# Patient Record
Sex: Female | Born: 1935 | Race: White | Hispanic: No | Marital: Single | Smoking: Never smoker
Health system: Southern US, Community
[De-identification: ages and names within clinical notes are randomized; demographics above are authoritative.]

## PROBLEM LIST (undated history)

## (undated) DIAGNOSIS — H269 Unspecified cataract: Secondary | ICD-10-CM

## (undated) DIAGNOSIS — K219 Gastro-esophageal reflux disease without esophagitis: Secondary | ICD-10-CM

---

## 2018-04-14 ENCOUNTER — Inpatient Hospital Stay (HOSPITAL_COMMUNITY)
Admission: EM | Admit: 2018-04-14 | Discharge: 2018-04-20 | DRG: 871 | Disposition: A | Discharge status: Hospice | Payer: Medicare Other | Attending: Internal Medicine | Admitting: Internal Medicine

## 2018-04-14 ENCOUNTER — Emergency Department (HOSPITAL_COMMUNITY): Payer: Medicare Other

## 2018-04-14 ENCOUNTER — Other Ambulatory Visit: Payer: Self-pay

## 2018-04-14 ENCOUNTER — Observation Stay (HOSPITAL_COMMUNITY): Payer: Medicare Other

## 2018-04-14 ENCOUNTER — Encounter (HOSPITAL_COMMUNITY): Payer: Self-pay | Admitting: *Deleted

## 2018-04-14 DIAGNOSIS — J9601 Acute respiratory failure with hypoxia: Secondary | ICD-10-CM | POA: Diagnosis present

## 2018-04-14 DIAGNOSIS — E440 Moderate protein-calorie malnutrition: Secondary | ICD-10-CM | POA: Diagnosis present

## 2018-04-14 DIAGNOSIS — D649 Anemia, unspecified: Secondary | ICD-10-CM | POA: Diagnosis not present

## 2018-04-14 DIAGNOSIS — F0390 Unspecified dementia without behavioral disturbance: Secondary | ICD-10-CM | POA: Diagnosis present

## 2018-04-14 DIAGNOSIS — E538 Deficiency of other specified B group vitamins: Secondary | ICD-10-CM | POA: Diagnosis present

## 2018-04-14 DIAGNOSIS — J189 Pneumonia, unspecified organism: Secondary | ICD-10-CM | POA: Diagnosis present

## 2018-04-14 DIAGNOSIS — J90 Pleural effusion, not elsewhere classified: Secondary | ICD-10-CM | POA: Diagnosis not present

## 2018-04-14 DIAGNOSIS — N183 Chronic kidney disease, stage 3 (moderate): Secondary | ICD-10-CM | POA: Diagnosis present

## 2018-04-14 DIAGNOSIS — R601 Generalized edema: Secondary | ICD-10-CM

## 2018-04-14 DIAGNOSIS — C787 Secondary malignant neoplasm of liver and intrahepatic bile duct: Secondary | ICD-10-CM | POA: Diagnosis present

## 2018-04-14 DIAGNOSIS — Z7982 Long term (current) use of aspirin: Secondary | ICD-10-CM

## 2018-04-14 DIAGNOSIS — E8779 Other fluid overload: Secondary | ICD-10-CM | POA: Diagnosis not present

## 2018-04-14 DIAGNOSIS — A409 Streptococcal sepsis, unspecified: Secondary | ICD-10-CM | POA: Diagnosis not present

## 2018-04-14 DIAGNOSIS — I5033 Acute on chronic diastolic (congestive) heart failure: Secondary | ICD-10-CM | POA: Diagnosis present

## 2018-04-14 DIAGNOSIS — Z79899 Other long term (current) drug therapy: Secondary | ICD-10-CM

## 2018-04-14 DIAGNOSIS — Z515 Encounter for palliative care: Secondary | ICD-10-CM | POA: Diagnosis not present

## 2018-04-14 DIAGNOSIS — N179 Acute kidney failure, unspecified: Secondary | ICD-10-CM | POA: Diagnosis present

## 2018-04-14 DIAGNOSIS — Z791 Long term (current) use of non-steroidal anti-inflammatories (NSAID): Secondary | ICD-10-CM

## 2018-04-14 DIAGNOSIS — I471 Supraventricular tachycardia: Secondary | ICD-10-CM | POA: Diagnosis present

## 2018-04-14 DIAGNOSIS — Z8249 Family history of ischemic heart disease and other diseases of the circulatory system: Secondary | ICD-10-CM

## 2018-04-14 DIAGNOSIS — L8990 Pressure ulcer of unspecified site, unspecified stage: Secondary | ICD-10-CM | POA: Clinically undetermined

## 2018-04-14 DIAGNOSIS — K219 Gastro-esophageal reflux disease without esophagitis: Secondary | ICD-10-CM | POA: Diagnosis present

## 2018-04-14 DIAGNOSIS — R6510 Systemic inflammatory response syndrome (SIRS) of non-infectious origin without acute organ dysfunction: Secondary | ICD-10-CM | POA: Diagnosis not present

## 2018-04-14 DIAGNOSIS — D631 Anemia in chronic kidney disease: Secondary | ICD-10-CM | POA: Diagnosis present

## 2018-04-14 DIAGNOSIS — Z66 Do not resuscitate: Secondary | ICD-10-CM | POA: Diagnosis not present

## 2018-04-14 DIAGNOSIS — N2889 Other specified disorders of kidney and ureter: Secondary | ICD-10-CM

## 2018-04-14 DIAGNOSIS — E46 Unspecified protein-calorie malnutrition: Secondary | ICD-10-CM | POA: Diagnosis present

## 2018-04-14 DIAGNOSIS — R0602 Shortness of breath: Secondary | ICD-10-CM

## 2018-04-14 DIAGNOSIS — E8770 Fluid overload, unspecified: Secondary | ICD-10-CM | POA: Diagnosis present

## 2018-04-14 DIAGNOSIS — I484 Atypical atrial flutter: Secondary | ICD-10-CM | POA: Diagnosis not present

## 2018-04-14 DIAGNOSIS — C649 Malignant neoplasm of unspecified kidney, except renal pelvis: Secondary | ICD-10-CM | POA: Diagnosis present

## 2018-04-14 DIAGNOSIS — J9811 Atelectasis: Secondary | ICD-10-CM | POA: Diagnosis present

## 2018-04-14 DIAGNOSIS — Z6821 Body mass index (BMI) 21.0-21.9, adult: Secondary | ICD-10-CM

## 2018-04-14 DIAGNOSIS — N289 Disorder of kidney and ureter, unspecified: Secondary | ICD-10-CM

## 2018-04-14 DIAGNOSIS — I130 Hypertensive heart and chronic kidney disease with heart failure and stage 1 through stage 4 chronic kidney disease, or unspecified chronic kidney disease: Secondary | ICD-10-CM | POA: Diagnosis present

## 2018-04-14 DIAGNOSIS — E871 Hypo-osmolality and hyponatremia: Secondary | ICD-10-CM | POA: Diagnosis not present

## 2018-04-14 DIAGNOSIS — I4891 Unspecified atrial fibrillation: Secondary | ICD-10-CM | POA: Diagnosis present

## 2018-04-14 DIAGNOSIS — C799 Secondary malignant neoplasm of unspecified site: Secondary | ICD-10-CM

## 2018-04-14 HISTORY — DX: Unspecified cataract: H26.9

## 2018-04-14 HISTORY — DX: Gastro-esophageal reflux disease without esophagitis: K21.9

## 2018-04-14 LAB — PROTIME-INR
INR: 1.2 (ref 0.8–1.2)
Prothrombin Time: 15.2 seconds (ref 11.4–15.2)

## 2018-04-14 LAB — CBC WITH DIFFERENTIAL/PLATELET
Abs Immature Granulocytes: 0 10*3/uL (ref 0.00–0.07)
Basophils Absolute: 0 10*3/uL (ref 0.0–0.1)
Basophils Relative: 0 %
Eosinophils Absolute: 0 10*3/uL (ref 0.0–0.5)
Eosinophils Relative: 0 %
HCT: 28.6 % — ABNORMAL LOW (ref 36.0–46.0)
HEMOGLOBIN: 8.8 g/dL — AB (ref 12.0–15.0)
Lymphocytes Relative: 1 %
Lymphs Abs: 0.2 10*3/uL — ABNORMAL LOW (ref 0.7–4.0)
MCH: 25.1 pg — ABNORMAL LOW (ref 26.0–34.0)
MCHC: 30.8 g/dL (ref 30.0–36.0)
MCV: 81.7 fL (ref 80.0–100.0)
Monocytes Absolute: 0.2 10*3/uL (ref 0.1–1.0)
Monocytes Relative: 1 %
Neutro Abs: 23.1 10*3/uL — ABNORMAL HIGH (ref 1.7–7.7)
Neutrophils Relative %: 98 %
Platelets: 430 10*3/uL — ABNORMAL HIGH (ref 150–400)
RBC: 3.5 MIL/uL — ABNORMAL LOW (ref 3.87–5.11)
RDW: 19.4 % — ABNORMAL HIGH (ref 11.5–15.5)
WBC: 23.6 10*3/uL — ABNORMAL HIGH (ref 4.0–10.5)
nRBC: 0 % (ref 0.0–0.2)
nRBC: 1 /100 WBC — ABNORMAL HIGH

## 2018-04-14 LAB — COMPREHENSIVE METABOLIC PANEL
ALT: 9 U/L (ref 0–44)
AST: 29 U/L (ref 15–41)
Albumin: 1.6 g/dL — ABNORMAL LOW (ref 3.5–5.0)
Alkaline Phosphatase: 76 U/L (ref 38–126)
Anion gap: 11 (ref 5–15)
BUN: 38 mg/dL — ABNORMAL HIGH (ref 8–23)
CHLORIDE: 107 mmol/L (ref 98–111)
CO2: 18 mmol/L — AB (ref 22–32)
Calcium: 9.3 mg/dL (ref 8.9–10.3)
Creatinine, Ser: 2 mg/dL — ABNORMAL HIGH (ref 0.44–1.00)
GFR calc Af Amer: 25 mL/min — ABNORMAL LOW (ref >60)
GFR calc non Af Amer: 22 mL/min — ABNORMAL LOW (ref >60)
Glucose, Bld: 89 mg/dL (ref 70–99)
Potassium: 5.1 mmol/L (ref 3.5–5.1)
Sodium: 136 mmol/L (ref 135–145)
Total Bilirubin: 0.4 mg/dL (ref 0.3–1.2)
Total Protein: 5.4 g/dL — ABNORMAL LOW (ref 6.5–8.1)

## 2018-04-14 LAB — URINALYSIS, ROUTINE W REFLEX MICROSCOPIC
Bilirubin Urine: NEGATIVE
Glucose, UA: NEGATIVE mg/dL
Ketones, ur: NEGATIVE mg/dL
Nitrite: NEGATIVE
Protein, ur: 100 mg/dL — AB
Specific Gravity, Urine: 1.024 (ref 1.005–1.030)
pH: 5 (ref 5.0–8.0)

## 2018-04-14 LAB — LACTIC ACID, PLASMA
Lactic Acid, Venous: 2.4 mmol/L (ref 0.5–1.9)
Lactic Acid, Venous: 2.3 mmol/L (ref 0.5–1.9)

## 2018-04-14 LAB — APTT: aPTT: 28 seconds (ref 24–36)

## 2018-04-14 LAB — I-STAT TROPONIN, ED: TROPONIN I, POC: 0.07 ng/mL (ref 0.00–0.08)

## 2018-04-14 LAB — PROCALCITONIN: Procalcitonin: 4.39 ng/mL

## 2018-04-14 LAB — BRAIN NATRIURETIC PEPTIDE: B Natriuretic Peptide: 652.9 pg/mL — ABNORMAL HIGH (ref 0.0–100.0)

## 2018-04-14 MED ORDER — VANCOMYCIN HCL IN DEXTROSE 1-5 GM/200ML-% IV SOLN: 1000.0000 mg | Freq: Once | INTRAVENOUS | Status: DC

## 2018-04-14 MED ORDER — ONDANSETRON HCL 4 MG/2ML IJ SOLN: 4.0000 mg | Freq: Four times a day (QID) | INTRAMUSCULAR | Status: DC | PRN

## 2018-04-14 MED ORDER — SODIUM CHLORIDE 0.9% FLUSH: 3.0000 mL | INTRAVENOUS | Status: DC | PRN

## 2018-04-14 MED ORDER — VANCOMYCIN VARIABLE DOSE PER UNSTABLE RENAL FUNCTION (PHARMACIST DOSING): Status: DC

## 2018-04-14 MED ORDER — VANCOMYCIN HCL 10 G IV SOLR
1250.0000 mg | Freq: Once | INTRAVENOUS | Status: AC
  Administered 2018-04-15: 1250 mg via INTRAVENOUS
  Filled 2018-04-14 (×2): qty 1250

## 2018-04-14 MED ORDER — SODIUM CHLORIDE 0.9% FLUSH
3.0000 mL | Freq: Two times a day (BID) | INTRAVENOUS | Status: DC
  Administered 2018-04-14 – 2018-04-19 (×11): 3 mL via INTRAVENOUS
  Administered 2018-04-20: 10 mL via INTRAVENOUS

## 2018-04-14 MED ORDER — SODIUM CHLORIDE 0.9 % IV SOLN
1.0000 g | INTRAVENOUS | Status: DC
  Administered 2018-04-15 – 2018-04-18 (×5): 1 g via INTRAVENOUS
  Filled 2018-04-14 (×7): qty 1

## 2018-04-14 MED ORDER — SODIUM CHLORIDE 0.9 % IV SOLN
500.0000 mg | Freq: Once | INTRAVENOUS | Status: DC
  Filled 2018-04-14: qty 500

## 2018-04-14 MED ORDER — SODIUM CHLORIDE 0.9 % IV SOLN: 2.0000 g | Freq: Once | INTRAVENOUS | Status: DC

## 2018-04-14 MED ORDER — SODIUM CHLORIDE 0.9 % IV SOLN
1.0000 g | Freq: Once | INTRAVENOUS | Status: DC
  Filled 2018-04-14: qty 10

## 2018-04-14 MED ORDER — ACETAMINOPHEN 325 MG PO TABS
650.0000 mg | ORAL_TABLET | ORAL | Status: DC | PRN
  Administered 2018-04-17 – 2018-04-18 (×2): 650 mg via ORAL
  Filled 2018-04-14 (×2): qty 2

## 2018-04-14 MED ORDER — ENSURE ENLIVE PO LIQD
237.0000 mL | Freq: Two times a day (BID) | ORAL | Status: DC
  Administered 2018-04-15 – 2018-04-20 (×7): 237 mL via ORAL

## 2018-04-14 MED ORDER — ALUM & MAG HYDROXIDE-SIMETH 200-200-20 MG/5ML PO SUSP
15.0000 mL | Freq: Four times a day (QID) | ORAL | Status: DC | PRN
  Administered 2018-04-15: 15 mL via ORAL
  Filled 2018-04-14 (×2): qty 30

## 2018-04-14 MED ORDER — METRONIDAZOLE IN NACL 5-0.79 MG/ML-% IV SOLN
500.0000 mg | Freq: Three times a day (TID) | INTRAVENOUS | Status: DC
  Administered 2018-04-14 – 2018-04-17 (×7): 500 mg via INTRAVENOUS
  Filled 2018-04-14 (×7): qty 100

## 2018-04-14 MED ORDER — SODIUM CHLORIDE 0.9 % IV SOLN: 250.0000 mL | INTRAVENOUS | Status: DC | PRN

## 2018-04-14 MED ORDER — FUROSEMIDE 10 MG/ML IJ SOLN
40.0000 mg | Freq: Two times a day (BID) | INTRAMUSCULAR | Status: DC
  Administered 2018-04-14 – 2018-04-16 (×4): 40 mg via INTRAVENOUS
  Filled 2018-04-14 (×4): qty 4

## 2018-04-14 NOTE — Progress Notes (Signed)
Pharmacy Antibiotic Note  Nicole Lowery is a 85 y.o. female admitted on 04/14/2018 with sepsis.  Pharmacy has been consulted for Vanc and Cefepime dosing.  Creat Clearance 18 ml/min.  Plan: Vanc 1250 mg IV x 1, then pharmacy will provide variable dosing based on renal function. Cefepime 1 gm IV q24hr Monitor renal function, C&S and vanc levels as needed  Height: 5\' 8"  (172.7 cm) Weight: 130 lb (59 kg) IBW/kg (Calculated) : 63.9  Temp (24hrs), Avg:99.6 F (37.6 C), Min:99.2 F (37.3 C), Max:99.9 F (37.7 C)  Recent Labs  Lab 04/14/18 1612 04/14/18 1836  WBC 23.6*  --   CREATININE 2.00*  --   LATICACIDVEN  --  2.4*    Estimated Creatinine Clearance: 17.8 mL/min (A) (by C-G formula based on SCr of 2 mg/dL (H)).    No Known Allergies  Antimicrobials this admission: Vanc 3/15 >>  Cefepime 3/15 >>  Flagyl 3/15 >>  Thank you for allowing pharmacy to be a part of this patient's care.  Alanda Slim, PharmD, [facility] Clinical Pharmacist Please see AMION for all Pharmacists' Contact Phone Numbers 04/14/2018, 7:46 PM

## 2018-04-14 NOTE — ED Notes (Signed)
ED TO INPATIENT HANDOFF REPORT  ED Nurse Name and Phone #: Jess F [phone]  S Name/Age/Gender Nicole Lowery 85 y.o. female Room/Bed: RESUSC/RESUSC  Code Status   Code Status: Full Code  Home/SNF/Other Home Patient oriented to: self, place, time and situation Is this baseline? Yes   Triage Complete: Triage complete  Chief Complaint swelling  Triage Note The pt just moved here [place] family from [place] 3-4 days ago.. she was hospitalized there  Feb  1st  For 3 days she has been weaKer and fell 3 days ago.  They brought her in today due to feet and leg swelling  For 3 days.  Low 02 sats at present  Pt confused  Disoriented  Family reports that she has dementia    Allergies No Known Allergies  Level of Care/Admitting Diagnosis ED Disposition    ED Disposition Condition [place]: [place] [[id number]]  Level of Care: Progressive [102]  I expect the patient will be discharged within 24 hours: No (not a candidate for 5C-Observation unit)  Diagnosis: Acute respiratory failure with hypoxia [facility]) [[id number]]  Admitting Physician: Vianne Bulls [[id number]]  Attending Physician: Vianne Bulls [[id number]]  PT Class (Do Not Modify): Observation [104]  PT Acc Code (Do Not Modify): Observation [10022]       B Medical/Surgery History Past Medical History:  Diagnosis Date  . Cataracts, bilateral   . GERD (gastroesophageal reflux disease)    History reviewed. No pertinent surgical history.   A IV Location/Drains/Wounds Patient Lines/Drains/Airways Status   Active Line/Drains/Airways    Name:   Placement date:   Placement time:   Site:   Days:   Peripheral IV 04/14/18 Right Antecubital   04/14/18    1947    Antecubital   less than 1          Intake/Output Last 24 hours No intake or output data in the 24 hours ending 04/14/18 1958  Labs/Imaging Results for orders placed or performed during the hospital encounter of 04/14/18  (from the past 48 hour(s))  Comprehensive metabolic panel     Status: Abnormal   Collection Time: 04/14/18  4:12 PM  Result Value Ref Range   Sodium 136 135 - 145 mmol/L   Potassium 5.1 3.5 - 5.1 mmol/L   Chloride 107 98 - 111 mmol/L   CO2 18 (L) 22 - 32 mmol/L   Glucose, Bld 89 70 - 99 mg/dL   BUN 38 (H) 8 - 23 mg/dL   Creatinine, Ser 2.00 (H) 0.44 - 1.00 mg/dL   Calcium 9.3 8.9 - 10.3 mg/dL   Total Protein 5.4 (L) 6.5 - 8.1 g/dL   Albumin 1.6 (L) 3.5 - 5.0 g/dL   AST 29 15 - 41 U/L   ALT 9 0 - 44 U/L   Alkaline Phosphatase 76 38 - 126 U/L   Total Bilirubin 0.4 0.3 - 1.2 mg/dL   GFR calc non Af Amer 22 (L) >60 mL/min   GFR calc Af Amer 25 (L) >60 mL/min   Anion gap 11 5 - 15    Comment: Performed at [facility], [address], [place], [place] [address]  CBC WITH DIFFERENTIAL     Status: Abnormal   Collection Time: 04/14/18  4:12 PM  Result Value Ref Range   WBC 23.6 (H) 4.0 - 10.5 K/uL   RBC 3.50 (L) 3.87 - 5.11 MIL/uL   Hemoglobin 8.8 (L) 12.0 - 15.0 g/dL  HCT 28.6 (L) 36.0 - 46.0 %   MCV 81.7 80.0 - 100.0 fL   MCH 25.1 (L) 26.0 - 34.0 pg   MCHC 30.8 30.0 - 36.0 g/dL   RDW 19.4 (H) 11.5 - 15.5 %   Platelets 430 (H) 150 - 400 K/uL   nRBC 0.0 0.0 - 0.2 %   Neutrophils Relative % 98 %   Neutro Abs 23.1 (H) 1.7 - 7.7 K/uL   Lymphocytes Relative 1 %   Lymphs Abs 0.2 (L) 0.7 - 4.0 K/uL   Monocytes Relative 1 %   Monocytes Absolute 0.2 0.1 - 1.0 K/uL   Eosinophils Relative 0 %   Eosinophils Absolute 0.0 0.0 - 0.5 K/uL   Basophils Relative 0 %   Basophils Absolute 0.0 0.0 - 0.1 K/uL   nRBC 1 (H) 0 /100 WBC   Abs Immature Granulocytes 0.00 0.00 - 0.07 K/uL   Acanthocytes PRESENT     Comment: Performed at [facility], [address] [place], [place] [address]  Brain natriuretic peptide     Status: Abnormal   Collection Time: 04/14/18  4:12 PM  Result Value Ref Range   B Natriuretic Peptide 652.9 (H) 0.0 - 100.0 pg/mL    Comment: Performed at [facility] [address] [place], [place] [address]  I-Stat Troponin, ED (not at [facility])     Status: None   Collection Time: 04/14/18  4:23 PM  Result Value Ref Range   Troponin i, poc 0.07 0.00 - 0.08 ng/mL   Comment 3            Comment: Due to the release kinetics of cTnI, a negative result within the first hours of the onset of symptoms does not rule out myocardial infarction with certainty. If myocardial infarction is still suspected, repeat the test at appropriate intervals.   Urinalysis, Routine w reflex microscopic     Status: Abnormal   Collection Time: 04/14/18  5:48 PM  Result Value Ref Range   Color, Urine AMBER (A) YELLOW    Comment: BIOCHEMICALS MAY BE AFFECTED BY COLOR   APPearance HAZY (A) CLEAR   Specific Gravity, Urine 1.024 1.005 - 1.030   pH 5.0 5.0 - 8.0   Glucose, UA NEGATIVE NEGATIVE mg/dL   Hgb urine dipstick SMALL (A) NEGATIVE   Bilirubin Urine NEGATIVE NEGATIVE   Ketones, ur NEGATIVE NEGATIVE mg/dL   Protein, ur 100 (A) NEGATIVE mg/dL   Nitrite NEGATIVE NEGATIVE   Leukocytes,Ua SMALL (A) NEGATIVE   RBC / HPF 0-5 0 - 5 RBC/hpf   WBC, UA 6-10 0 - 5 WBC/hpf   Bacteria, UA RARE (A) NONE SEEN   Squamous Epithelial / LPF 0-5 0 - 5   Mucus PRESENT    Hyaline Casts, UA PRESENT     Comment: Performed at [facility], [address], [place], [place] [address]  Lactic acid, plasma     Status: Abnormal   Collection Time: 04/14/18  6:36 PM  Result Value Ref Range   Lactic Acid, Venous 2.4 (HH) 0.5 - 1.9 mmol/L    Comment: CRISCO, C RN @ 1914 ON 04/14/2018 BY TEMOCHE, H Performed at [facility], [place] [address], [place], [place] [address]    Dg Chest Port 1 View  Result Date: 04/14/2018 CLINICAL DATA:  Weakness.  Recent hospitalization.  Leg swelling. EXAM: PORTABLE CHEST 1 VIEW COMPARISON:  None. FINDINGS: Cardiomegaly. The hila and mediastinum are normal. No pneumothorax. Bilateral pleural  effusions with underlying opacities. No other  abnormalities. IMPRESSION: Bilateral pleural effusions with underlying opacities. The underlying opacities are most likely compressive atelectasis. No other acute abnormality. Electronically Signed   By: Dorise Bullion III M.D   On: 04/14/2018 16:07    Pending Labs Unresulted Labs (From admission, onward)    Start     Ordered   04/16/18 5997  Basic metabolic panel  Daily,   R     04/14/18 1929   04/15/18 1900  Vancomycin, random  Once,   R     04/14/18 1948   04/15/18 0500  CBC with Differential  Tomorrow morning,   R     04/14/18 1929   04/15/18 0500  Comprehensive metabolic panel  Tomorrow morning,   R     04/14/18 1929   04/14/18 2230  Lactic acid, plasma  Now then every 3 hours,   STAT     04/14/18 1929   04/14/18 1929  Procalcitonin  Once,   R     04/14/18 1929   04/14/18 1929  Protime-INR  Once,   R     04/14/18 1929   04/14/18 1929  APTT  Once,   R     04/14/18 1929   04/14/18 1905  Blood culture (routine x 2)  BLOOD CULTURE X 2,   STAT     04/14/18 1904   04/14/18 1833  Respiratory Panel by PCR  (Respiratory virus panel with precautions)  Once,   R     04/14/18 1832   04/14/18 1538  Urine culture  ONCE - STAT,   STAT     04/14/18 1537          Vitals/Pain Today's Vitals   04/14/18 1815 04/14/18 1830 04/14/18 1945 04/14/18 1948  BP: 105/74 (!) 109/91    Pulse:  (!) 57 (!) 39   Resp: 18 18 (!) 24   Temp:      TempSrc:      SpO2:  99% 93%   Weight:      Height:      PainSc:    0-No pain    Isolation Precautions Droplet precaution  Medications Medications  sodium chloride flush (NS) 0.9 % injection 3 mL (has no administration in time range)  sodium chloride flush (NS) 0.9 % injection 3 mL (has no administration in time range)  0.9 %  sodium chloride infusion (has no administration in time range)  acetaminophen (TYLENOL) tablet 650 mg (has no administration in time range)  ondansetron (ZOFRAN) injection 4 mg (has no administration in time range)   metroNIDAZOLE (FLAGYL) IVPB 500 mg (500 mg Intravenous New Bag/Given 04/14/18 1951)  vancomycin (VANCOCIN) 1,250 mg in sodium chloride 0.9 % 250 mL IVPB (has no administration in time range)  vancomycin variable dose per unstable renal function (pharmacist dosing) (has no administration in time range)  ceFEPIme (MAXIPIME) 1 g in sodium chloride 0.9 % 100 mL IVPB (has no administration in time range)    Mobility walks with person assist High fall risk   Focused Assessments Sepsis   R Recommendations: See Admitting Provider Note  Report given to:   Additional Notes: n/a

## 2018-04-14 NOTE — ED Triage Notes (Signed)
The pt just moved here wioth family from [place] 3-4 days ago.. she was hospitalized there  Feb  1st  For 3 days she has been weaKer and fell 3 days ago.  They brought her in today due to feet and leg swelling  For 3 days.  Low 02 sats at present  Pt confused  Disoriented  Family reports that she has dementia

## 2018-04-14 NOTE — ED Provider Notes (Signed)
[facility] EMERGENCY DEPARTMENT Provider Note   CSN: [id number] Arrival date & time: 04/14/18  1508    History   Chief Complaint Chief Complaint  Patient presents with  . Weakness    HPI Nicole Lowery is a 85 y.o. female.     The history is provided by the patient.  Shortness of Breath  Severity:  Moderate Onset quality:  Gradual Timing:  Constant Progression:  Worsening Chronicity:  New Context comment:  Sent from clinic for hypoxia and concern for heart failure.  Relieved by:  Nothing Worsened by:  Exertion Associated symptoms: no abdominal pain, no chest pain, no cough, no ear pain, no fever, no rash, no sore throat, no sputum production, no vomiting and no wheezing   Risk factors: no hx of PE/DVT     Past Medical History:  Diagnosis Date  . Cataracts, bilateral   . GERD (gastroesophageal reflux disease)     Patient Active Problem List   Diagnosis Date Noted  . Acute respiratory failure with hypoxia ([place]) 04/14/2018  . Renal insufficiency 04/14/2018  . Normocytic anemia 04/14/2018  . Unspecified atrial fibrillation ([place]) 04/14/2018  . SIRS (systemic inflammatory response syndrome) ([place]) 04/14/2018  . Dementia ([place]) 04/14/2018  . Hypervolemia 04/14/2018  . Pleural effusion 04/14/2018  . Protein calorie malnutrition ([place]) 04/14/2018    History reviewed. No pertinent surgical history.   OB History   No obstetric history on file.      Home Medications    Prior to Admission medications   Medication Sig Start Date End Date Taking? Authorizing Provider  aspirin EC 81 MG tablet Take 81 mg by mouth daily.   Yes [provider]  Calcium Carb-Cholecalciferol (CALCIUM 500 +D PO) Take 1 tablet by mouth 2 (two) times daily.   Yes [provider]  donepezil (ARICEPT) 10 MG tablet Take 10 mg by mouth daily. 02/06/18  Yes [provider]  dorzolamide (TRUSOPT) 2 % ophthalmic solution Place 1 drop into the left eye  2 (two) times daily.   Yes [provider]  esomeprazole (NEXIUM) 20 MG capsule Take 20 mg by mouth daily.    Yes [provider]  ibuprofen (ADVIL,MOTRIN) 200 MG tablet Take 400 mg by mouth every 6 (six) hours as needed for headache (pain).   Yes [provider]  latanoprost (XALATAN) 0.005 % ophthalmic solution Place 1 drop into both eyes at bedtime. 03/27/18  Yes [provider]  meloxicam (MOBIC) 15 MG tablet Take 7.5 mg by mouth 2 (two) times daily. 03/19/18  Yes [provider]  memantine (NAMENDA XR) 28 MG CP24 24 hr capsule Take 28 mg by mouth daily. 03/19/18  Yes [provider]  metoprolol succinate (TOPROL-XL) 50 MG 24 hr tablet Take 50 mg by mouth daily. 03/09/18  Yes [provider]  vitamin B-12 (CYANOCOBALAMIN) 1000 MCG tablet Take 1,000 mcg by mouth daily.   Yes [provider]  Vitamin D, Cholecalciferol, 50 MCG (2000 UT) CAPS Take 2,000 Units by mouth daily.   Yes [provider]    Family History Family History  Problem Relation Age of Onset  . Hypertension Other     Social History Social History   Tobacco Use  . Smoking status: Never Smoker  . Smokeless tobacco: Never Used  Substance Use Topics  . Alcohol use: Never    Frequency: Never  . Drug use: Never     Allergies   Patient has no known allergies.   Review  of Systems Review of Systems  Constitutional: Negative for chills and fever.  HENT: Negative for ear pain and sore throat.   Eyes: Negative for pain and visual disturbance.  Respiratory: Positive for shortness of breath. Negative for cough, sputum production and wheezing.   Cardiovascular: Positive for leg swelling. Negative for chest pain and palpitations.  Gastrointestinal: Negative for abdominal pain and vomiting.  Genitourinary: Positive for frequency. Negative for dysuria and hematuria.  Musculoskeletal: Negative for arthralgias and back pain.  Skin: Negative for  color change and rash.  Neurological: Negative for seizures and syncope.  All other systems reviewed and are negative.    Physical Exam Updated Vital Signs  ED Triage Vitals  Enc Vitals Group     BP 04/14/18 1528 (!) 117/50     Pulse Rate 04/14/18 1545 (!) 29     Resp 04/14/18 1528 20     Temp 04/14/18 1528 99.2 F (37.3 C)     Temp Source 04/14/18 1528 Temporal     SpO2 04/14/18 1528 (!) 88 %     Weight 04/14/18 1537 130 lb (59 kg)     Height 04/14/18 1537 5\' 8"  (1.727 m)     Head Circumference --      Peak Flow --      Pain Score 04/14/18 1537 0     Pain Loc --      Pain Edu? --      Excl. in [place]? --      Physical Exam Vitals signs and nursing note reviewed.  Constitutional:      General: She is not in acute distress.    Appearance: She is well-developed.  HENT:     Head: Normocephalic and atraumatic.     Nose: Nose normal. No congestion.     Mouth/Throat:     Mouth: Mucous membranes are moist.  Eyes:     Extraocular Movements: Extraocular movements intact.     Conjunctiva/sclera: Conjunctivae normal.     Pupils: Pupils are equal, round, and reactive to light.  Neck:     Musculoskeletal: Neck supple.  Cardiovascular:     Rate and Rhythm: Normal rate and regular rhythm.     Pulses: Normal pulses.     Heart sounds: Normal heart sounds. No murmur.  Pulmonary:     Effort: Pulmonary effort is normal. No respiratory distress.     Breath sounds: Rales present.  Abdominal:     General: Abdomen is flat. There is no distension.     Palpations: Abdomen is soft.     Tenderness: There is no abdominal tenderness.  Musculoskeletal:     Right lower leg: Edema (2+) present.     Left lower leg: Edema (2+) present.  Skin:    General: Skin is warm and dry.     Capillary Refill: Capillary refill takes less than 2 seconds.  Neurological:     General: No focal deficit present.     Mental Status: She is alert.  Psychiatric:        Mood and Affect: Mood normal.      ED  Treatments / Results  Labs (all labs ordered are listed, but only abnormal results are displayed) Labs Reviewed  COMPREHENSIVE METABOLIC PANEL - Abnormal; Notable for the following components:      Result Value   CO2 18 (*)    BUN 38 (*)    Creatinine, Ser 2.00 (*)    Total Protein 5.4 (*)    Albumin 1.6 (*)  GFR calc non Af Amer 22 (*)    GFR calc Af Amer 25 (*)    All other components within normal limits  CBC WITH DIFFERENTIAL/PLATELET - Abnormal; Notable for the following components:   WBC 23.6 (*)    RBC 3.50 (*)    Hemoglobin 8.8 (*)    HCT 28.6 (*)    MCH 25.1 (*)    RDW 19.4 (*)    Platelets 430 (*)    Neutro Abs 23.1 (*)    Lymphs Abs 0.2 (*)    nRBC 1 (*)    All other components within normal limits  BRAIN NATRIURETIC PEPTIDE - Abnormal; Notable for the following components:   B Natriuretic Peptide 652.9 (*)    All other components within normal limits  URINALYSIS, ROUTINE W REFLEX MICROSCOPIC - Abnormal; Notable for the following components:   Color, Urine AMBER (*)    APPearance HAZY (*)    Hgb urine dipstick SMALL (*)    Protein, ur 100 (*)    Leukocytes,Ua SMALL (*)    Bacteria, UA RARE (*)    All other components within normal limits  LACTIC ACID, PLASMA - Abnormal; Notable for the following components:   Lactic Acid, Venous 2.4 (*)    All other components within normal limits  URINE CULTURE  RESPIRATORY PANEL BY PCR  CULTURE, BLOOD (ROUTINE X 2)  CULTURE, BLOOD (ROUTINE X 2)  PROTIME-INR  APTT  CBC WITH DIFFERENTIAL/PLATELET  COMPREHENSIVE METABOLIC PANEL  LACTIC ACID, PLASMA  LACTIC ACID, PLASMA  PROCALCITONIN  SODIUM, URINE, RANDOM  CREATININE, URINE, RANDOM  UREA NITROGEN, URINE  VITAMIN B12  FOLATE  IRON AND TIBC  FERRITIN  RETICULOCYTES  PREALBUMIN  TSH  I-STAT TROPONIN, ED    EKG EKG Interpretation  Date/Time:  Sunday April 14 2018 15:23:42 EDT Ventricular Rate:  96 PR Interval:    QRS Duration: 83 QT Interval:  340 QTC  Calculation: 419 R Axis:   49 Text Interpretation:  Atrial fibrillation Low voltage, extremity and precordial leads Consider anterior infarct Confirmed by Lennice Sites [phone]) on 04/14/2018 3:39:00 PM   Radiology Dg Chest Port 1 View  Result Date: 04/14/2018 CLINICAL DATA:  Weakness.  Recent hospitalization.  Leg swelling. EXAM: PORTABLE CHEST 1 VIEW COMPARISON:  None. FINDINGS: Cardiomegaly. The hila and mediastinum are normal. No pneumothorax. Bilateral pleural effusions with underlying opacities. No other abnormalities. IMPRESSION: Bilateral pleural effusions with underlying opacities. The underlying opacities are most likely compressive atelectasis. No other acute abnormality. Electronically Signed   By: Dorise Bullion III M.D   On: 04/14/2018 16:07    Procedures .Critical Care Performed by: Lennice Sites, DO Authorized by: Lennice Sites, DO   Critical care provider statement:    Critical care time (minutes):  35   Critical care time was exclusive of:  Separately billable procedures and treating other patients and teaching time   Critical care was necessary to treat or prevent imminent or life-threatening deterioration of the following conditions:  Respiratory failure   Critical care was time spent personally by me on the following activities:  Blood draw for specimens, development of treatment plan with patient or surrogate, discussions with primary provider, examination of patient, evaluation of patient's response to treatment, obtaining history from patient or surrogate, ordering and performing treatments and interventions, ordering and review of laboratory studies, ordering and review of radiographic studies, pulse oximetry, re-evaluation of patient's condition and review of old charts   I assumed direction of critical care for this patient  from another provider in my specialty: no     (including critical care time)  Medications Ordered in ED Medications  sodium chloride flush  (NS) 0.9 % injection 3 mL (has no administration in time range)  sodium chloride flush (NS) 0.9 % injection 3 mL (has no administration in time range)  0.9 %  sodium chloride infusion (has no administration in time range)  acetaminophen (TYLENOL) tablet 650 mg (has no administration in time range)  ondansetron (ZOFRAN) injection 4 mg (has no administration in time range)  metroNIDAZOLE (FLAGYL) IVPB 500 mg (500 mg Intravenous New Bag/Given 04/14/18 1951)  vancomycin (VANCOCIN) 1,250 mg in sodium chloride 0.9 % 250 mL IVPB (has no administration in time range)  vancomycin variable dose per unstable renal function (pharmacist dosing) (has no administration in time range)  ceFEPIme (MAXIPIME) 1 g in sodium chloride 0.9 % 100 mL IVPB (has no administration in time range)  furosemide (LASIX) injection 40 mg (has no administration in time range)     Initial Impression / Assessment and Plan / ED Course  I have reviewed the triage vital signs and the nursing notes.  Pertinent labs & imaging results that were available during my care of the patient were reviewed by me and considered in my medical decision making (see chart for details).     Nicole Lowery is an 85 year old female with history of reflux who presents the ED with shortness of breath, hypoxia.  Patient needing 2 L of oxygen to maintain oxygen saturation above 90%.  Patient was sent from clinic due to concern for volume overload.  She has 3+ pitting edema bilaterally in her legs.  Rales on exam.  She has had increasing shortness of breath over the last several days.  Had recent admission about a month ago for same thing but is not currently on Lasix.  Patient has dementia.  Is here with family.  They state that she has been overall at her usual state of health.  No recent travel, no known coronavirus exposure.  Patient has low-grade temperature of 99.9.  But denies any sputum production, no cough.  Patient did have leukocytosis of 25 but no  obvious urine infection, questionable pneumonia so will treat with IV antibiotics given recent hospital admission.  Will give vancomycin and cefepime.  Patient also to be given Flagyl.  Chest x-ray and overall history and physical seem more consistent with volume overload, elevated BNP. But lactic acid is mildly elevated. There is some concern for sepsis. So blood cultures were collected.  Influenza testing is also pending.  Patient had no signs urinary tract infection.  No significant anemia, electrolyte abnormality.  Patient admitted to hospitalist service for acute hypoxic respiratory failure likely from volume overload, possible pneumonia.  Hemodynamically stable throughout my care.  This chart was dictated using voice recognition software.  Despite best efforts to proofread,  errors can occur which can change the documentation meaning.    Final Clinical Impressions(s) / ED Diagnoses   Final diagnoses:  SIRS (systemic inflammatory response syndrome) ([place])  AKI (acute kidney injury) [facility])    ED Discharge Orders    None       Lennice Sites, DO 04/14/18 2029

## 2018-04-14 NOTE — H&P (Signed)
History and Physical    Nicole Lowery KVQ:[id number] DOB: 11-Oct-1926 DOA: 04/14/2018  PCP: No primary care provider on file.   Patient coming from: Home   Chief Complaint: Gen weakness, leg swelling   HPI: Nicole Lowery is a 85 y.o. female with medical history significant for dementia, admitted to the hospital in February with atrial fibrillation and acute CHF, now presenting to the ED for evaluation of progressive generalized weakness and lower extremity edema.  Patient is accompanied by her son and daughter-in-law who assist with the history.  She had reportedly been doing fairly well until last month when she became weak and short of breath, and she was admitted to [facility] in [facility] where she was diuresed and noted to be in atrial fibrillation.  She reportedly had an echocardiogram performed during that admission, results not available at this time, and she was discharged on metoprolol.  She followed up with her PCP who gave her 3 additional days of Lasix and with a cardiologist to had her wear an ambulatory monitor that showed PACs, but no atrial fibrillation.  She continued to do fairly well at home with family until insidiously worsening weakness and lower extremity edema over the past 2 weeks.  She is no longer able to wear her shoes due to swelling and is unable to ascend 3 steps to their house without significant dyspnea.  She has not complained of anything, but family reports that she rarely does.  She has not been noted to be coughing much.  Family is unaware of any history of kidney disease.  ED Course: Upon arrival to the ED, patient is found to have a temperature of 37.7 C, saturating mid 80s on room air, slightly tachypneic, and with blood pressure 100/55.  EKG features atrial fibrillation.  Chest x-ray is notable for bilateral pleural effusions and underlying opacities that are felt to most likely reflect compressive atelectasis.  Chemistry  panel is notable for albumin of 1.6, BUN 38, and creatinine of 2.0.  CBC features a leukocytosis to 23,600, normocytic anemia with hemoglobin 8.8, and thrombocytosis with platelets 430,000.  Lactic acid is elevated to 2.4, troponin is normal, and BNP elevated to 653.  Blood and urine cultures were ordered, respiratory virus panel was ordered, and patient was started on empiric antibiotics in the ED.  She was placed on 2 L/min of supplemental oxygen.  She remains hemodynamically stable, is not in acute distress, but is dyspneic at rest with new oxygen requirement and will be observed in the hospital for ongoing evaluation and management of this.  Review of Systems:  All other systems reviewed and apart from HPI, are negative.  Past Medical History:  Diagnosis Date   Cataracts, bilateral    GERD (gastroesophageal reflux disease)     History reviewed. No pertinent surgical history.   reports that she has never smoked. She has never used smokeless tobacco. She reports that she does not drink alcohol or use drugs.  No Known Allergies  Family History  Problem Relation Age of Onset   Hypertension Other      Prior to Admission medications   Not on File    Physical Exam: Vitals:   04/14/18 1745 04/14/18 1815 04/14/18 1830 04/14/18 1945  BP: 114/64 105/74 (!) 109/91   Pulse: (!) 35  (!) 57 (!) 39  Resp: (!) 21 18 18  (!) 24  Temp:      TempSrc:      SpO2: 96%  99% 93%  Weight:      Height:        Constitutional: NAD, calm  Eyes: PERTLA, lids and conjunctivae normal ENMT: Mucous membranes are moist. Posterior pharynx clear of any exudate or lesions.   Neck: normal, supple, no masses, no thyromegaly Respiratory: Diminished at bases. Mild tachypnea, no wheezing. No accessory muscle use.  Cardiovascular: Rate ~80 and irregular. Pitting edema involving b/l LE's.  Abdomen: No distension, soft, mild tenderness in lower abd without rebound pain or guarding. Bowel sounds active.    Musculoskeletal: no clubbing / cyanosis. No joint deformity upper and lower extremities.   Skin: no significant rashes, lesions, ulcers. Warm, dry, well-perfused. Neurologic: No facial asymmetry. Sensation intact. Moving all extremities.  Psychiatric: Alert.  Oriented to person and place only. Pleasant, cooperative.    Labs on Admission: I have personally reviewed following labs and imaging studies  CBC: Recent Labs  Lab 04/14/18 1612  WBC 23.6*  NEUTROABS 23.1*  HGB 8.8*  HCT 28.6*  MCV 81.7  PLT 448*   Basic Metabolic Panel: Recent Labs  Lab 04/14/18 1612  NA 136  K 5.1  CL 107  CO2 18*  GLUCOSE 89  BUN 38*  CREATININE 2.00*  CALCIUM 9.3   GFR: Estimated Creatinine Clearance: 17.8 mL/min (A) (by C-G formula based on SCr of 2 mg/dL (H)). Liver Function Tests: Recent Labs  Lab 04/14/18 1612  AST 29  ALT 9  ALKPHOS 76  BILITOT 0.4  PROT 5.4*  ALBUMIN 1.6*   No results for input(s): LIPASE, AMYLASE in the last 168 hours. No results for input(s): AMMONIA in the last 168 hours. Coagulation Profile: No results for input(s): INR, PROTIME in the last 168 hours. Cardiac Enzymes: No results for input(s): CKTOTAL, CKMB, CKMBINDEX, TROPONINI in the last 168 hours. BNP (last 3 results) No results for input(s): PROBNP in the last 8760 hours. HbA1C: No results for input(s): HGBA1C in the last 72 hours. CBG: No results for input(s): GLUCAP in the last 168 hours. Lipid Profile: No results for input(s): CHOL, HDL, LDLCALC, TRIG, CHOLHDL, LDLDIRECT in the last 72 hours. Thyroid Function Tests: No results for input(s): TSH, T4TOTAL, FREET4, T3FREE, THYROIDAB in the last 72 hours. Anemia Panel: No results for input(s): VITAMINB12, FOLATE, FERRITIN, TIBC, IRON, RETICCTPCT in the last 72 hours. Urine analysis:    Component Value Date/Time   COLORURINE AMBER (A) 04/14/2018 1748   APPEARANCEUR HAZY (A) 04/14/2018 1748   LABSPEC 1.024 04/14/2018 1748   PHURINE 5.0  04/14/2018 1748   GLUCOSEU NEGATIVE 04/14/2018 1748   HGBUR SMALL (A) 04/14/2018 1748   BILIRUBINUR NEGATIVE 04/14/2018 [place] 04/14/2018 1748   PROTEINUR 100 (A) 04/14/2018 1748   NITRITE NEGATIVE 04/14/2018 1748   LEUKOCYTESUR SMALL (A) 04/14/2018 1748   Sepsis Labs: @LABRCNTIP (procalcitonin:4,lacticidven:4) )No results found for this or any previous visit (from the past 240 hour(s)).   Radiological Exams on Admission: Dg Chest Port 1 View  Result Date: 04/14/2018 CLINICAL DATA:  Weakness.  Recent hospitalization.  Leg swelling. EXAM: PORTABLE CHEST 1 VIEW COMPARISON:  None. FINDINGS: Cardiomegaly. The hila and mediastinum are normal. No pneumothorax. Bilateral pleural effusions with underlying opacities. No other abnormalities. IMPRESSION: Bilateral pleural effusions with underlying opacities. The underlying opacities are most likely compressive atelectasis. No other acute abnormality. Electronically Signed   By: Dorise Bullion III M.D   On: 04/14/2018 16:07    EKG: Independently reviewed. Atrial fibrillation.   Assessment/Plan   1. Acute respiratory failure with hypoxia   -  Presents with progressive generalized weakness and bilateral leg edema, found to be saturating in mid-80's while at rest  - She has bilateral pleural effusions on CXR with underlying opacities felt to reflect compressive atelectasis  - Respiratory virus panel is pending  - Likely secondary to CHF; had echo last month at outside hospital, will try to get records  - Continue supplemental O2, follow-up cultures and resp viral panel, diurese as below    2. Acute CHF  - Presents with increasing gen weakness and bilateral LE edema, found to be saturating mid-80's on rm air while at rest  - She was hospitalized in February, diuresed, and had echocardiogram with results not available  - Obtain medical records from recent admission in [place], [place]   - Continue cardiac monitoring, diurese with Lasix  40 mg IV q12h as BP allows with close monitoring of renal function and electrolytes    3. SIRS  - Presents with generalized weakness and bilateral LE edema   - Found to have leukocytosis to 23,600 with elevated lactate, mild tachypnea and hypoxia, temp 37.7 C, and not tachycardic on metoprolol   - Blood and urine cultures collected in ED and empiric antiboitics started  - Continue antibiotics for now while following cultures and clinical course   4. Atrial fibrillation  - Appears to be in rate-controlled atrial fibrillation on admission  - She saw cardiology in [place], [place] after recent hospitalization, had ambulatory heart monitor, and was told she had frequent PAC's but not a fib on monitoring  - CHADS-VASc at least 65 (age x2, gender) and she likely has CHF  - She is not on anticoagulation; with Hgb only 8.8 and unknown baseline, and given report of no a fib on ambulatory monitoring, will hold off on anticoagulation now, continue cardiac monitoring, treat respiratory illness and SIRS, check TSH and mag level    5. Renal insufficiency  - SCr is 2.00 on admission with no prior labs available  - She is hypervolemic on admission   - Renally-dose medications, check urine chemistries and renal US, follow daily chem panel during diuresis    6. Anemia  - Hgb is 8.8 on admission with no priors available for comparison  - No melena or hematochezia has been noted  - Check anemia panel, repeat CBC in am   7. Protein-calorie malnutrition  - Patient is frail with albumin only 1.6  - Dietary consultation, check prealbumin     DVT prophylaxis: SCD's  Code Status: Full  Family Communication: Son and daughter-in-law updated at bedside Consults called: None Admission status: Observation     Vianne Bulls, MD [facility] Pager [phone]  If 7PM-7AM, please contact night-coverage [email or web address] Password [facility]  04/14/2018, 8:01 PM

## 2018-04-14 NOTE — ED Notes (Signed)
Pt has dementia

## 2018-04-14 NOTE — ED Notes (Signed)
Admitting at bedside 

## 2018-04-15 DIAGNOSIS — E8779 Other fluid overload: Secondary | ICD-10-CM | POA: Diagnosis not present

## 2018-04-15 DIAGNOSIS — F0390 Unspecified dementia without behavioral disturbance: Secondary | ICD-10-CM

## 2018-04-15 DIAGNOSIS — Z515 Encounter for palliative care: Secondary | ICD-10-CM | POA: Diagnosis not present

## 2018-04-15 DIAGNOSIS — J90 Pleural effusion, not elsewhere classified: Secondary | ICD-10-CM | POA: Diagnosis not present

## 2018-04-15 DIAGNOSIS — I484 Atypical atrial flutter: Secondary | ICD-10-CM | POA: Diagnosis not present

## 2018-04-15 DIAGNOSIS — I4891 Unspecified atrial fibrillation: Secondary | ICD-10-CM | POA: Diagnosis present

## 2018-04-15 DIAGNOSIS — I471 Supraventricular tachycardia: Secondary | ICD-10-CM

## 2018-04-15 DIAGNOSIS — N183 Chronic kidney disease, stage 3 (moderate): Secondary | ICD-10-CM | POA: Diagnosis present

## 2018-04-15 DIAGNOSIS — E538 Deficiency of other specified B group vitamins: Secondary | ICD-10-CM | POA: Diagnosis present

## 2018-04-15 DIAGNOSIS — I5033 Acute on chronic diastolic (congestive) heart failure: Secondary | ICD-10-CM | POA: Diagnosis present

## 2018-04-15 DIAGNOSIS — C787 Secondary malignant neoplasm of liver and intrahepatic bile duct: Secondary | ICD-10-CM | POA: Diagnosis present

## 2018-04-15 DIAGNOSIS — J9811 Atelectasis: Secondary | ICD-10-CM | POA: Diagnosis present

## 2018-04-15 DIAGNOSIS — R600 Localized edema: Secondary | ICD-10-CM

## 2018-04-15 DIAGNOSIS — E871 Hypo-osmolality and hyponatremia: Secondary | ICD-10-CM | POA: Diagnosis not present

## 2018-04-15 DIAGNOSIS — N2889 Other specified disorders of kidney and ureter: Secondary | ICD-10-CM

## 2018-04-15 DIAGNOSIS — L8990 Pressure ulcer of unspecified site, unspecified stage: Secondary | ICD-10-CM

## 2018-04-15 DIAGNOSIS — J189 Pneumonia, unspecified organism: Secondary | ICD-10-CM | POA: Diagnosis present

## 2018-04-15 DIAGNOSIS — Z791 Long term (current) use of non-steroidal anti-inflammatories (NSAID): Secondary | ICD-10-CM | POA: Diagnosis not present

## 2018-04-15 DIAGNOSIS — R6510 Systemic inflammatory response syndrome (SIRS) of non-infectious origin without acute organ dysfunction: Secondary | ICD-10-CM | POA: Diagnosis present

## 2018-04-15 DIAGNOSIS — K219 Gastro-esophageal reflux disease without esophagitis: Secondary | ICD-10-CM | POA: Diagnosis present

## 2018-04-15 DIAGNOSIS — E440 Moderate protein-calorie malnutrition: Secondary | ICD-10-CM

## 2018-04-15 DIAGNOSIS — I130 Hypertensive heart and chronic kidney disease with heart failure and stage 1 through stage 4 chronic kidney disease, or unspecified chronic kidney disease: Secondary | ICD-10-CM | POA: Diagnosis present

## 2018-04-15 DIAGNOSIS — Z6821 Body mass index (BMI) 21.0-21.9, adult: Secondary | ICD-10-CM | POA: Diagnosis not present

## 2018-04-15 DIAGNOSIS — Z66 Do not resuscitate: Secondary | ICD-10-CM | POA: Diagnosis not present

## 2018-04-15 DIAGNOSIS — A409 Streptococcal sepsis, unspecified: Secondary | ICD-10-CM | POA: Diagnosis present

## 2018-04-15 DIAGNOSIS — C799 Secondary malignant neoplasm of unspecified site: Secondary | ICD-10-CM | POA: Diagnosis not present

## 2018-04-15 DIAGNOSIS — J9601 Acute respiratory failure with hypoxia: Secondary | ICD-10-CM | POA: Diagnosis present

## 2018-04-15 DIAGNOSIS — C649 Malignant neoplasm of unspecified kidney, except renal pelvis: Secondary | ICD-10-CM | POA: Diagnosis present

## 2018-04-15 DIAGNOSIS — R7881 Bacteremia: Secondary | ICD-10-CM | POA: Diagnosis not present

## 2018-04-15 DIAGNOSIS — N179 Acute kidney failure, unspecified: Secondary | ICD-10-CM | POA: Diagnosis present

## 2018-04-15 DIAGNOSIS — D631 Anemia in chronic kidney disease: Secondary | ICD-10-CM | POA: Diagnosis present

## 2018-04-15 DIAGNOSIS — I509 Heart failure, unspecified: Secondary | ICD-10-CM | POA: Diagnosis not present

## 2018-04-15 DIAGNOSIS — I4892 Unspecified atrial flutter: Secondary | ICD-10-CM

## 2018-04-15 DIAGNOSIS — F0280 Dementia in other diseases classified elsewhere without behavioral disturbance: Secondary | ICD-10-CM | POA: Diagnosis not present

## 2018-04-15 DIAGNOSIS — R601 Generalized edema: Secondary | ICD-10-CM | POA: Diagnosis not present

## 2018-04-15 LAB — COMPREHENSIVE METABOLIC PANEL
ALT: 10 U/L (ref 0–44)
AST: 28 U/L (ref 15–41)
Albumin: 1.7 g/dL — ABNORMAL LOW (ref 3.5–5.0)
Alkaline Phosphatase: 85 U/L (ref 38–126)
Anion gap: 13 (ref 5–15)
BUN: 43 mg/dL — ABNORMAL HIGH (ref 8–23)
CHLORIDE: 105 mmol/L (ref 98–111)
CO2: 17 mmol/L — ABNORMAL LOW (ref 22–32)
Calcium: 9.4 mg/dL (ref 8.9–10.3)
Creatinine, Ser: 1.97 mg/dL — ABNORMAL HIGH (ref 0.44–1.00)
GFR calc Af Amer: 25 mL/min — ABNORMAL LOW (ref >60)
GFR calc non Af Amer: 22 mL/min — ABNORMAL LOW (ref >60)
GLUCOSE: 103 mg/dL — AB (ref 70–99)
Potassium: 5.1 mmol/L (ref 3.5–5.1)
SODIUM: 135 mmol/L (ref 135–145)
Total Bilirubin: 0.8 mg/dL (ref 0.3–1.2)
Total Protein: 5.8 g/dL — ABNORMAL LOW (ref 6.5–8.1)

## 2018-04-15 LAB — CBC WITH DIFFERENTIAL/PLATELET
Band Neutrophils: 0 %
Basophils Absolute: 0 10*3/uL (ref 0.0–0.1)
Basophils Relative: 0 %
Blasts: 0 %
Eosinophils Absolute: 0.2 10*3/uL (ref 0.0–0.5)
Eosinophils Relative: 1 %
HCT: 31.8 % — ABNORMAL LOW (ref 36.0–46.0)
Hemoglobin: 9.6 g/dL — ABNORMAL LOW (ref 12.0–15.0)
Lymphocytes Relative: 3 %
Lymphs Abs: 0.7 10*3/uL (ref 0.7–4.0)
MCH: 24.1 pg — ABNORMAL LOW (ref 26.0–34.0)
MCHC: 30.2 g/dL (ref 30.0–36.0)
MCV: 79.9 fL — ABNORMAL LOW (ref 80.0–100.0)
METAMYELOCYTES PCT: 0 %
MONO ABS: 0.7 10*3/uL (ref 0.1–1.0)
Monocytes Relative: 3 %
Myelocytes: 0 %
Neutro Abs: 20.1 10*3/uL — ABNORMAL HIGH (ref 1.7–7.7)
Neutrophils Relative %: 93 %
Other: 0 %
PLATELETS: 483 10*3/uL — AB (ref 150–400)
Promyelocytes Relative: 0 %
RBC: 3.98 MIL/uL (ref 3.87–5.11)
RDW: 19.3 % — ABNORMAL HIGH (ref 11.5–15.5)
WBC: 21.7 10*3/uL — ABNORMAL HIGH (ref 4.0–10.5)
nRBC: 0 % (ref 0.0–0.2)
nRBC: 0 /100 WBC

## 2018-04-15 LAB — RESPIRATORY PANEL BY PCR
Adenovirus: NOT DETECTED
Bordetella pertussis: NOT DETECTED
CORONAVIRUS OC43-RVPPCR: NOT DETECTED
Chlamydophila pneumoniae: NOT DETECTED
Coronavirus 229E: NOT DETECTED
Coronavirus HKU1: NOT DETECTED
Coronavirus NL63: NOT DETECTED
Influenza A: NOT DETECTED
Influenza B: NOT DETECTED
MYCOPLASMA PNEUMONIAE-RVPPCR: NOT DETECTED
Metapneumovirus: NOT DETECTED
Parainfluenza Virus 1: NOT DETECTED
Parainfluenza Virus 2: NOT DETECTED
Parainfluenza Virus 3: NOT DETECTED
Parainfluenza Virus 4: NOT DETECTED
Respiratory Syncytial Virus: NOT DETECTED
Rhinovirus / Enterovirus: NOT DETECTED

## 2018-04-15 LAB — CREATININE, URINE, RANDOM: Creatinine, Urine: 198.2 mg/dL

## 2018-04-15 LAB — IRON AND TIBC
Iron: 7 ug/dL — ABNORMAL LOW (ref 28–170)
SATURATION RATIOS: 6 % — AB (ref 10.4–31.8)
TIBC: 125 ug/dL — ABNORMAL LOW (ref 250–450)
UIBC: 118 ug/dL

## 2018-04-15 LAB — FOLATE: Folate: 5.1 ng/mL — ABNORMAL LOW (ref >5.9)

## 2018-04-15 LAB — RETICULOCYTES
Immature Retic Fract: 31.6 % — ABNORMAL HIGH (ref 2.3–15.9)
RBC.: 3.98 MIL/uL (ref 3.87–5.11)
Retic Count, Absolute: 68.1 10*3/uL (ref 19.0–186.0)
Retic Ct Pct: 1.7 % (ref 0.4–3.1)

## 2018-04-15 LAB — LACTIC ACID, PLASMA
Lactic Acid, Venous: 3.1 mmol/L (ref 0.5–1.9)
Lactic Acid, Venous: 2.5 mmol/L (ref 0.5–1.9)

## 2018-04-15 LAB — URINE CULTURE: Culture: NO GROWTH

## 2018-04-15 LAB — PREALBUMIN: Prealbumin: <5 mg/dL — ABNORMAL LOW (ref 18–38)

## 2018-04-15 LAB — FERRITIN: Ferritin: 1019 ng/mL — ABNORMAL HIGH (ref 11–307)

## 2018-04-15 LAB — VITAMIN B12: VITAMIN B 12: 4847 pg/mL — AB (ref 180–914)

## 2018-04-15 LAB — SODIUM, URINE, RANDOM: Sodium, Ur: 15 mmol/L

## 2018-04-15 LAB — TSH: TSH: 1.816 u[IU]/mL (ref 0.350–4.500)

## 2018-04-15 LAB — MRSA PCR SCREENING: MRSA by PCR: NEGATIVE

## 2018-04-15 MED ORDER — ORAL CARE MOUTH RINSE
15.0000 mL | Freq: Two times a day (BID) | OROMUCOSAL | Status: DC
  Administered 2018-04-16 – 2018-04-20 (×7): 15 mL via OROMUCOSAL

## 2018-04-15 NOTE — Progress Notes (Addendum)
CRITICAL VALUE ALERT  Critical Value:  Lactic acid 3.1  Date & Time Notied:  04/15/2018 2:12 AM   Provider Notified: [place]  Orders Received/Actions taken: orders made

## 2018-04-15 NOTE — Consult Note (Signed)
CARDIOLOGY CONSULT NOTE  Patient ID: Nicole Lowery MRN: [id number] DOB/AGE: Jun 13, 1926 85 y.o.  Admit date: 04/14/2018 Referring Physician  [facility] Primary Physician:  No primary care provider on file. Reason for Consultation  Tachycardia  HPI:   86 y.o. caucasian female  with hypertension, progressive dementia, now admitted with progressive generalized weakness and leg edema.  Most of the history obtained from patient's son, daughter-in-law, and chart review. Patient unable to provide reliable history. She was living by herself until recently, when she started developing the above complaints. She had hospitalization in [place] and was then seen by cardiologist with [facility]-Dr. Ladell Pier. She was told not to have Afib,but rather have PAC's. She reportedly underwent an echocardiogram, results not available to me. Patient has had recent cough. Workup in the ED was significant for leukocytosis, elevated lactic acid, bilateral pleural effusions on chest Xray.   Son reports that there has been rapid functional decline in patient's health over the last few weeks. Dementia has also progressed to very limited short term memory at this time.   Past Medical History:  Diagnosis Date  . Cataracts, bilateral   . GERD (gastroesophageal reflux disease)      History reviewed. No pertinent surgical history.   Family History  Problem Relation Age of Onset  . Hypertension Other      Social History: Social History   Socioeconomic History  . Marital status: Single    Spouse name: Not on file  . Number of children: Not on file  . Years of education: Not on file  . Highest education level: Not on file  Occupational History  . Not on file  Social Needs  . Financial resource strain: Not on file  . Food insecurity:    Worry: Not on file    Inability: Not on file  . Transportation needs:    Medical: Not on file    Non-medical: Not on file  Tobacco Use  . Smoking status:  Never Smoker  . Smokeless tobacco: Never Used  Substance and Sexual Activity  . Alcohol use: Never    Frequency: Never  . Drug use: Never  . Sexual activity: Not on file  Lifestyle  . Physical activity:    Days per week: Not on file    Minutes per session: Not on file  . Stress: Not on file  Relationships  . Social connections:    Talks on phone: Not on file    Gets together: Not on file    Attends religious service: Not on file    Active member of club or organization: Not on file    Attends meetings of clubs or organizations: Not on file    Relationship status: Not on file  . Intimate partner violence:    Fear of current or ex partner: Not on file    Emotionally abused: Not on file    Physically abused: Not on file    Forced sexual activity: Not on file  Other Topics Concern  . Not on file  Social History Narrative  . Not on file     Medications Prior to Admission  Medication Sig Dispense Refill Last Dose  . aspirin EC 81 MG tablet Take 81 mg by mouth daily.   04/13/2018 at noon  . Calcium Carb-Cholecalciferol (CALCIUM 500 +D PO) Take 1 tablet by mouth 2 (two) times daily.   04/13/2018 at pm  . donepezil (ARICEPT) 10 MG tablet Take 10 mg by mouth daily.   04/13/2018 at  noon  . dorzolamide (TRUSOPT) 2 % ophthalmic solution Place 1 drop into the left eye 2 (two) times daily.   04/14/2018 at am  . esomeprazole (NEXIUM) 20 MG capsule Take 20 mg by mouth daily.    04/14/2018 at am  . ibuprofen (ADVIL,MOTRIN) 200 MG tablet Take 400 mg by mouth every 6 (six) hours as needed for headache (pain).   04/11/2018  . latanoprost (XALATAN) 0.005 % ophthalmic solution Place 1 drop into both eyes at bedtime.   04/13/2018 at pm  . meloxicam (MOBIC) 15 MG tablet Take 7.5 mg by mouth 2 (two) times daily.   04/13/2018 at pm  . memantine (NAMENDA XR) 28 MG CP24 24 hr capsule Take 28 mg by mouth daily.   04/13/2018 at noon  . metoprolol succinate (TOPROL-XL) 50 MG 24 hr tablet Take 50 mg by mouth daily.    04/13/2018 at 1200  . vitamin B-12 (CYANOCOBALAMIN) 1000 MCG tablet Take 1,000 mcg by mouth daily.   04/13/2018 at noon  . Vitamin D, Cholecalciferol, 50 MCG (2000 UT) CAPS Take 2,000 Units by mouth daily.   04/13/2018 at noon    Review of Systems  Unable to perform ROS: dementia   See HPI for collateral history  Physical Exam: Physical Exam  Constitutional:  Appears cachectic Patient is able to lay flat comfortably without significant orthopnea  HENT:  Head: Normocephalic and atraumatic.  Eyes: Pupils are equal, round, and reactive to light. Conjunctivae are normal.  Neck: No JVD present.  Cardiovascular: Regular rhythm. Frequent extrasystoles are present. Tachycardia present.  Distal pulses not palpable due to edema   Pulmonary/Chest: Effort normal.  B/l breast sounds diminished at bases  Abdominal: Soft. Bowel sounds are normal. There is no abdominal tenderness.  Musculoskeletal:        General: Edema (3+ b/l) present.  Skin: Skin is warm.  Nursing note and vitals reviewed.    Labs:   Lab Results  Component Value Date   WBC 21.7 (H) 04/15/2018   HGB 9.6 (L) 04/15/2018   HCT 31.8 (L) 04/15/2018   MCV 79.9 (L) 04/15/2018   PLT 483 (H) 04/15/2018    Recent Labs  Lab 04/15/18 0133  NA 135  K 5.1  CL 105  CO2 17*  BUN 43*  CREATININE 1.97*  CALCIUM 9.4  PROT 5.8*  BILITOT 0.8  ALKPHOS 85  ALT 10  AST 28  GLUCOSE 103*    Lipid Panel  No results found for: CHOL, TRIG, HDL, CHOLHDL, VLDL, LDLCALC  BNP (last 3 results) Recent Labs    04/14/18 1612  BNP 652.9*    HEMOGLOBIN A1C No results found for: HGBA1C, MPG  Cardiac Panel (last 3 results) No results for input(s): CKTOTAL, CKMB, TROPONINI, RELINDX in the last 8760 hours.  No results found for: CKTOTAL, CKMB, CKMBINDEX, TROPONINI   TSH Recent Labs    04/15/18 0133  TSH 1.816      Radiology: US Renal  Result Date: 04/15/2018 CLINICAL DATA:  Acute kidney injury EXAM: RENAL / URINARY  TRACT ULTRASOUND COMPLETE COMPARISON:  None. FINDINGS: Right Kidney: Renal measurements: 12.6 x 9.0 x 9.0 cm = volume: 532 mL. Right kidney is enlarged with heterogeneous echotexture diffusely. Echogenic shadowing foci within the hilum could reflect vascular calcifications or nonobstructing ureteral stones. Left Kidney: Renal measurements: 10.7 x 4.0 x 4.6 cm = volume: 102 ML. Echogenicity within normal limits. No mass or hydronephrosis visualized. Bladder: Appears normal for degree of bladder distention. IMPRESSION: Diffusely abnormal appearance of  the right kidney which is enlarged with diffusely heterogeneous echotexture. Cannot exclude infiltrating mass such as lymphoma or renal cell carcinoma. Consider further evaluation with MRI or CT with IV contrast. No hydronephrosis. Electronically Signed   By: Rolm Baptise M.D.   On: 04/15/2018 00:16   Dg Chest Port 1 View  Result Date: 04/14/2018 CLINICAL DATA:  Weakness.  Recent hospitalization.  Leg swelling. EXAM: PORTABLE CHEST 1 VIEW COMPARISON:  None. FINDINGS: Cardiomegaly. The hila and mediastinum are normal. No pneumothorax. Bilateral pleural effusions with underlying opacities. No other abnormalities. IMPRESSION: Bilateral pleural effusions with underlying opacities. The underlying opacities are most likely compressive atelectasis. No other acute abnormality. Electronically Signed   By: Dorise Bullion III M.D   On: 04/14/2018 16:07    Scheduled Meds: . feeding supplement (ENSURE ENLIVE)  237 mL Oral BID BM  . furosemide  40 mg Intravenous Q12H  . [START ON 04/16/2018] mouth rinse  15 mL Mouth Rinse BID  . sodium chloride flush  3 mL Intravenous Q12H  . vancomycin variable dose per unstable renal function (pharmacist dosing)   Does not apply See admin instructions   Continuous Infusions: . sodium chloride    . ceFEPime (MAXIPIME) IV Stopped (04/15/18 0700)  . metronidazole Stopped (04/15/18 0700)   PRN Meds:.sodium chloride, acetaminophen,  alum & mag hydroxide-simeth, ondansetron (ZOFRAN) IV, sodium chloride flush  CARDIAC STUDIES:  EKG 04/15/2018: Multifactorial atrial tachycardia. Low voltage.   Echocardiogram: Please obtain records from [facility] in [place]  Assessment & Recommendations:  84 y.o. caucasian female  with hypertension, progressive dementia, now admitted with progressive generalized weakness and leg edema.  Leg edema: While she may have a component of heart failure-as suggested by elevated BNP, I suspect her leg edema is at least partially contributed by her protein calorie malnutrition- as suggested by cachexia and hypoalbuminemia.She is able to lay flat without any significant orthopnea.  Agree with cautious use of lasix. Please obtain records from [facility] re: recent echocardiogram. Overall, given her advanced age and comorbidities, her long term prognosis remains guarded. Family aware and agree with conservative line of management.  Tachycardia: Multifocal atrial tachycardia. No definite evidence of Afib, although she remains at increased risk of Afib. Continue to monitor. Consider infection in the differential her leokocytosis, lung atelectesis.   Nigel Mormon, MD 04/15/2018, 9:58 AM [facility] Pager: [phone] Office: [phone] If no answer Cell [phone]

## 2018-04-15 NOTE — Progress Notes (Signed)
Initial Nutrition Assessment  DOCUMENTATION CODES:   Non-severe (moderate) malnutrition in context of chronic illness  INTERVENTION:    Ensure Enlive po BID, each supplement provides 350 kcal and 20 grams of protein  NUTRITION DIAGNOSIS:   Moderate Malnutrition related to chronic illness(CHF, dementia) as evidenced by moderate fat depletion, moderate muscle depletion  GOAL:   Patient will meet greater than or equal to 90% of their needs  MONITOR:   PO intake, Supplement acceptance, Labs, Skin, Weight trends, I & O's  REASON FOR ASSESSMENT:   Consult Assessment of nutrition requirement/status  ASSESSMENT:   85 y.o. Female with PMH significant for dementia, admitted to the hospital in February with atrial fibrillation and acute CHF, now presenting to the ED for evaluation of progressive generalized weakness and lower extremity edema.  RD spoke with pt's son and daughter-in-law. Pt is resting in bed at time of RD visit. Appetite is poor; ate bites of her lunch.  She is drinking some of her Ensure Enlive supplements. Family bought a [employer] shake last night; pt drank some. Family reports pt's weight has been stable (UBW is 115-120 lbs).  Labs & medications reviewed.  NUTRITION - FOCUSED PHYSICAL EXAM:    Most Recent Value  Orbital Region  Mild depletion  Upper Arm Region  Moderate depletion  Thoracic and Lumbar Region  Moderate depletion  Buccal Region  Mild depletion  Temple Region  Mild depletion  Clavicle Bone Region  Moderate depletion  Clavicle and Acromion Bone Region  Moderate depletion  Scapular Bone Region  Moderate depletion  Dorsal Hand  Mild depletion  Patellar Region  Unable to assess [edema]  Anterior Thigh Region  Unable to assess [edema]  Posterior Calf Region  Unable to assess [edema]  Edema (RD Assessment)  Moderate     Diet Order:   Diet Order            Diet Heart Room service appropriate? Yes; Fluid consistency: Thin  Diet  effective now             EDUCATION NEEDS:   Not appropriate for education at this time  Skin:  Skin Assessment: Skin Integrity Issues: Skin Integrity Issues:: Stage II Stage II: sacrum  Last BM:  N/A  Height:   Ht Readings from Last 1 Encounters:  04/14/18 5\' 8"  (1.727 m)   Weight:   Wt Readings from Last 1 Encounters:  04/15/18 62.8 kg   BMI:  Body mass index is 21.05 kg/m. highly skewed given fluid, pt likely underweight  Estimated Nutritional Needs:   Kcal:  1300-1500  Protein:  55-70 gm  Fluid:  >/= 1.5 L  Arthur Holms, RD, LDN Pager #: [phone] After-Hours Pager #: [phone]

## 2018-04-15 NOTE — Progress Notes (Signed)
PROGRESS NOTE    Nicole Lowery  XKG:[id number] DOB: 1926/04/04 DOA: 04/14/2018 PCP: System, Pcp Not In   Brief Narrative:  HPI per Dr. Mitzi Hansen on 04/14/2018 HPI: Nicole Lowery is a 85 y.o. female with medical history significant for dementia, admitted to the hospital in February with atrial fibrillation and acute CHF, now presenting to the ED for evaluation of progressive generalized weakness and lower extremity edema.  Patient is accompanied by her son and daughter-in-law who assist with the history.  She had reportedly been doing fairly well until last month when she became weak and short of breath, and she was admitted to [facility] in [facility] where she was diuresed and noted to be in atrial fibrillation.  She reportedly had an echocardiogram performed during that admission, results not available at this time, and she was discharged on metoprolol.  She followed up with her PCP who gave her 3 additional days of Lasix and with a cardiologist to had her wear an ambulatory monitor that showed PACs, but no atrial fibrillation.  She continued to do fairly well at home with family until insidiously worsening weakness and lower extremity edema over the past 2 weeks.  She is no longer able to wear her shoes due to swelling and is unable to ascend 3 steps to their house without significant dyspnea.  She has not complained of anything, but family reports that she rarely does.  She has not been noted to be coughing much.  Family is unaware of any history of kidney disease.  **Interim History Cardiology consulted for further evaluation recommendations and patient is continue be diuresed.  IV vancomycin was discontinued given MRSA PCR negative. Has A renal Mass noted so foley will be placed and discussed case with Urology who recommends imaging in 48 hours with CT if Cr is improved.   Assessment & Plan:   Principal Problem:   Acute respiratory failure with hypoxia  (HCC) Active Problems:   Renal insufficiency   Normocytic anemia   Unspecified atrial fibrillation (HCC)   SIRS (systemic inflammatory response syndrome) (HCC)   Dementia (HCC)   Hypervolemia   Pleural effusion   Protein calorie malnutrition (HCC)   Pressure injury of skin   Malnutrition of moderate degree  Acute respiratory failure with hypoxia   -Presented with progressive generalized weakness and bilateral leg edema, found to be saturating in mid-80's while at rest  -She has bilateral pleural effusions on CXR with underlying opacities felt to reflect compressive atelectasis  -Respiratory virus panel is Negative -Likely secondary to CHF; had echo last month at outside hospital, will try to get records  -Continue supplemental O2, follow-up cultures and resp viral panel, diurese as below   -Repeat CXR in AM   Acute CHF (Unknown Type at this point) -Presents with increasing gen weakness and bilateral LE edema, found to be saturating mid-80's on rm air while at rest  -She was hospitalized in February, diuresed, and had echocardiogram with results not available  -BNP on Admission was 652.9 -Obtain medical records from recent admission in [place], [place]   -Continue cardiac monitoring, diurese with Lasix 40 mg IV q12h as BP allows with close monitoring of renal function and electrolytes -Cardiology consulted for further evaluation and recommendations    -Strict I's/O's, Daily Weights -Foley for Monitoring   SIRS  - Presents with generalized weakness and bilateral LE edema   - Found to have leukocytosis to 23,600 with elevated lactate, mild tachypnea and hypoxia, temp 37.7  C, and not tachycardic on metoprolol   -Lactic Acid trended down to 2.5 and WBC was down to 21,7000  -Respiratory Virus Panel was Negative  -Blood and urine cultures collected in ED and empiric antiboitics started; - Continued antibiotics for now while following cultures and clinical course and D/C'd IV Vancomycin  given MRSA Negative -PCT was 4.39 -CXR showed "Bilateral pleural effusions with underlying opacities. The underlying opacities are most likely compressive atelectasis. No other acute abnormality." but ? If Actually has PNA -Repeat CXR in AM after Diuresis  -Follow Blood and Urine Cx's   Atrial Fibrillation ruled out and was in Multifocal Atrial Tachycardia - Appeared to be in rate-controlled atrial fibrillation on admission but Cardiology corrected and stated she had no definite A Fib here  -She saw cardiology in [place], [place] after recent hospitalization, had ambulatory heart monitor, and was told she had frequent PAC's but not a fib on monitoring  -CHADS-VASc at least 68 (age x2, gender) and she likely has CHF  - She is not on anticoagulation; with Hgb only 8.8 and unknown baseline, and given report of no a fib on ambulatory monitoring, will hold off on anticoagulation now -Continue cardiac monitoring, Treat respiratory illness and SIRS, -Checked TSH and was 1.816 and Mag Level was not done so will obtain in AM   Renal Insufficiency  -SCr is 2.00 on admission with no prior labs available  -BUN and creatinine was 43/1.97 -She is hypervolemic on admission   -Renally-dose medications  -Check urine chemistries (Urine Na+ was 15 and Urine Cr was 198) and renal US; Renal U/S showed Diffusely abnormal appearance of the right kidney which is enlarged with diffusely heterogeneous echotexture. Cannot exclude infiltrating mass such as lymphoma or renal cell carcinoma. No hydronephrosis -Analysis showed hazy appearance with amber color urine, small hemoglobin, negative nitrites, negative ketones, small leukocytes, rare bacteria, 100 protein, 6-10 WBCs -Continue to Monitor and Trend and follow daily CMP during diuresis   -Will Place Foley for Accurate I's/O's and Decompression to see if Cr comes down. After discussion with Urology if Cr trends down after 48 hours recommending Imaging with CT and if not then  obtaining MRI Abdomen w/wo Contrast   Suspected Renal Right Kidney Mass -Check MRI of the Abdomen and Pelvis without contrast given her renal function -Cannot do CT scan as her creatinine is 2 -Discussed with Urology Dr. Natividad Brood and he is recommending Placing Foley for now and watching Cr to see if CT can be done -He reviewed Films and feels that it is very odd for Renal Cell Carcinoma to look like that -Continue to Monitor and evaluate in a few days   Normocytic Anemia  -Hgb is 8.8 on admission with no priors available for comparison -Now Hgb/Hct is 9.6/31.8  -No melena or hematochezia has been noted  -Checked Anemia Panel and showed iron level of 7, U IBC of 118, TIBC of 125, saturation ratios of 6%, ferritin level 1019, folate level 5.1, and vitamin B12 level of 4847 -Continue to monitor for signs and symptoms of bleeding -Repeat CBC in the a.m.  Moderate (Non-Severe) Protein-calorie malnutrition  in the context of Chronic Illness -Patient is frail with albumin only 1.6  -Dietary consultation, check prealbumin and was <5  -Nutritionist recommending Ensure Enlive p.o. twice daily we will continue  DVT prophylaxis: SCDs  Code Status: FULL CODE Family Communication: Discussed with Son at bedside  Disposition Plan: Remain Inpatient for diuresis and workup  Consultants:   Cardiology  Discussed  Case with Urology   Procedures: Renal U/S   Antimicrobials:  Anti-infectives (From admission, onward)   Start     Dose/Rate Route Frequency Ordered Stop   04/14/18 2000  metroNIDAZOLE (FLAGYL) IVPB 500 mg     500 mg 100 mL/hr over 60 Minutes Intravenous Every 8 hours 04/14/18 1929     04/14/18 1945  vancomycin (VANCOCIN) 1,250 mg in sodium chloride 0.9 % 250 mL IVPB     1,250 mg 166.7 mL/hr over 90 Minutes Intravenous  Once 04/14/18 1943 04/15/18 0700   04/14/18 1945  ceFEPIme (MAXIPIME) 1 g in sodium chloride 0.9 % 100 mL IVPB     1 g 200 mL/hr over 30 Minutes Intravenous Every 24  hours 04/14/18 1943     04/14/18 1942  vancomycin variable dose per unstable renal function (pharmacist dosing)  Status:  Discontinued      Does not apply See admin instructions 04/14/18 1943 04/15/18 1015   04/14/18 1930  ceFEPIme (MAXIPIME) 2 g in sodium chloride 0.9 % 100 mL IVPB  Status:  Discontinued     2 g 200 mL/hr over 30 Minutes Intravenous  Once 04/14/18 1929 04/14/18 1946   04/14/18 1930  vancomycin (VANCOCIN) IVPB 1000 mg/200 mL premix  Status:  Discontinued     1,000 mg 200 mL/hr over 60 Minutes Intravenous  Once 04/14/18 1929 04/14/18 1945   04/14/18 1915  cefTRIAXone (ROCEPHIN) 1 g in sodium chloride 0.9 % 100 mL IVPB  Status:  Discontinued     1 g 200 mL/hr over 30 Minutes Intravenous  Once 04/14/18 1903 04/14/18 1925   04/14/18 1915  azithromycin (ZITHROMAX) 500 mg in sodium chloride 0.9 % 250 mL IVPB  Status:  Discontinued     500 mg 250 mL/hr over 60 Minutes Intravenous  Once 04/14/18 1903 04/14/18 1925     Subjective: Examined at bedside was fatigued appearing and felt weak.  States that she was told for breath.  Has some significant lower extremity swelling that her son states that is gotten worse recently.  No nausea or vomiting.  No lightheadedness or dizziness.  No other concerns or complaints at this time but daughter states that she has not been urinating very much.  Objective: Vitals:   04/15/18 0500 04/15/18 0755 04/15/18 1259 04/15/18 1736  BP:  105/78 108/60 118/64  Pulse:  (!) 120 (!) 122 (!) 130  Resp:  18 16 18   Temp:  98.2 F (36.8 C) 98.2 F (36.8 C) 97.8 F (36.6 C)  TempSrc:  Oral Oral Oral  SpO2:  (!) 87% 99% 100%  Weight: 62.8 kg     Height:        Intake/Output Summary (Last 24 hours) at 04/15/2018 1856 Last data filed at 04/15/2018 1314 Gross per 24 hour  Intake 750 ml  Output 520 ml  Net 230 ml   Filed Weights   04/14/18 1537 04/14/18 2045 04/15/18 0500  Weight: 59 kg 60.5 kg 62.8 kg   Examination: Physical  Exam:  Constitutional: Thin frail elderly Caucasian female in NAD and appears calm but uncomfortable Eyes: Lids and conjunctivae normal, sclerae anicteric  ENMT: External Ears, Nose appear normal. Grossly normal hearing. Mucous membranes are moist. Neck: Appears normal, supple, no cervical masses, normal ROM, no appreciable thyromegaly; Mild JVD Respiratory: Diminished to auscultation bilaterally with coarse breath sounds and crackles but no wheezing, rales, rhonchi. Normal respiratory effort and patient is not tachypenic. No accessory muscle use. Wearing supplemental O2 via [place] Cardiovascular: Irregular, no  murmurs / rubs / gallops. S1 and S2 auscultated. 3+ LE extremity edema.  Abdomen: Soft, non-tender, mildly tender. No masses palpated. No appreciable hepatosplenomegaly. Bowel sounds positive x4.  GU: Deferred. Musculoskeletal: No clubbing / cyanosis of digits/nails. No joint deformity upper and lower extremities.   Skin: No rashes, lesions, ulcers on a limited skin evaluation. No induration; Warm and dry.  Neurologic: CN 2-12 grossly intact with no focal deficits. Romberg sign and cerebellar reflexes not assessed.  Psychiatric: Impaired judgment and insight. Alert and oriented x 1. Slightly anxious mood and appropriate affect.   Data Reviewed: I have personally reviewed following labs and imaging studies  CBC: Recent Labs  Lab 04/14/18 1612 04/15/18 0133  WBC 23.6* 21.7*  NEUTROABS 23.1* 20.1*  HGB 8.8* 9.6*  HCT 28.6* 31.8*  MCV 81.7 79.9*  PLT 430* 811*   Basic Metabolic Panel: Recent Labs  Lab 04/14/18 1612 04/15/18 0133  NA 136 135  K 5.1 5.1  CL 107 105  CO2 18* 17*  GLUCOSE 89 103*  BUN 38* 43*  CREATININE 2.00* 1.97*  CALCIUM 9.3 9.4   GFR: Estimated Creatinine Clearance: 19.2 mL/min (A) (by C-G formula based on SCr of 1.97 mg/dL (H)). Liver Function Tests: Recent Labs  Lab 04/14/18 1612 04/15/18 0133  AST 29 28  ALT 9 10  ALKPHOS 76 85  BILITOT 0.4 0.8   PROT 5.4* 5.8*  ALBUMIN 1.6* 1.7*   No results for input(s): LIPASE, AMYLASE in the last 168 hours. No results for input(s): AMMONIA in the last 168 hours. Coagulation Profile: Recent Labs  Lab 04/14/18 1952  INR 1.2   Cardiac Enzymes: No results for input(s): CKTOTAL, CKMB, CKMBINDEX, TROPONINI in the last 168 hours. BNP (last 3 results) No results for input(s): PROBNP in the last 8760 hours. HbA1C: No results for input(s): HGBA1C in the last 72 hours. CBG: No results for input(s): GLUCAP in the last 168 hours. Lipid Profile: No results for input(s): CHOL, HDL, LDLCALC, TRIG, CHOLHDL, LDLDIRECT in the last 72 hours. Thyroid Function Tests: Recent Labs    04/15/18 0133  TSH 1.816   Anemia Panel: Recent Labs    04/15/18 0133  VITAMINB12 4,847*  FOLATE 5.1*  FERRITIN 1,019*  TIBC 125*  IRON 7*  RETICCTPCT 1.7   Sepsis Labs: Recent Labs  Lab 04/14/18 1836 04/14/18 1952 04/14/18 1957 04/15/18 0133 04/15/18 0528  PROCALCITON  --  4.39  --   --   --   LATICACIDVEN 2.4*  --  2.3* 3.1* 2.5*    Recent Results (from the past 240 hour(s))  Blood culture (routine x 2)     Status: None (Preliminary result)   Collection Time: 04/14/18  7:33 PM  Result Value Ref Range Status   Specimen Description BLOOD RIGHT ANTECUBITAL  Final   Special Requests   Final    BOTTLES DRAWN AEROBIC AND ANAEROBIC Blood Culture adequate volume   Culture   Final    NO GROWTH < 24 HOURS Performed at [facility], [place] [address], [place], [place] [address]    Report Status PENDING  Incomplete  Blood culture (routine x 2)     Status: None (Preliminary result)   Collection Time: 04/14/18  7:42 PM  Result Value Ref Range Status   Specimen Description BLOOD LEFT ANTECUBITAL  Final   Special Requests   Final    BOTTLES DRAWN AEROBIC AND ANAEROBIC Blood Culture adequate volume   Culture   Final    NO GROWTH <  24 HOURS Performed at [facility], [place] [address], [place],  [place] [address]    Report Status PENDING  Incomplete  MRSA PCR Screening     Status: None   Collection Time: 04/14/18  9:47 PM  Result Value Ref Range Status   MRSA by PCR NEGATIVE NEGATIVE Final    Comment:        The GeneXpert MRSA Assay (FDA approved for NASAL specimens only), is one component of a comprehensive MRSA colonization surveillance program. It is not intended to diagnose MRSA infection nor to guide or monitor treatment for MRSA infections. Performed at [facility], [place] [address] [place], [place] [address]   Respiratory Panel by PCR     Status: None   Collection Time: 04/14/18 10:39 PM  Result Value Ref Range Status   Adenovirus NOT DETECTED NOT DETECTED Final   Coronavirus 229E NOT DETECTED NOT DETECTED Final    Comment: (NOTE) The Coronavirus on the Respiratory Panel, DOES NOT test for the novel  Coronavirus (2019 nCoV)    Coronavirus HKU1 NOT DETECTED NOT DETECTED Final   Coronavirus NL63 NOT DETECTED NOT DETECTED Final   Coronavirus OC43 NOT DETECTED NOT DETECTED Final   Metapneumovirus NOT DETECTED NOT DETECTED Final   Rhinovirus / Enterovirus NOT DETECTED NOT DETECTED Final   Influenza A NOT DETECTED NOT DETECTED Final   Influenza B NOT DETECTED NOT DETECTED Final   Parainfluenza Virus 1 NOT DETECTED NOT DETECTED Final   Parainfluenza Virus 2 NOT DETECTED NOT DETECTED Final   Parainfluenza Virus 3 NOT DETECTED NOT DETECTED Final   Parainfluenza Virus 4 NOT DETECTED NOT DETECTED Final   Respiratory Syncytial Virus NOT DETECTED NOT DETECTED Final   Bordetella pertussis NOT DETECTED NOT DETECTED Final   Chlamydophila pneumoniae NOT DETECTED NOT DETECTED Final   Mycoplasma pneumoniae NOT DETECTED NOT DETECTED Final    Comment: Performed at [facility], [place]. [address], [place], [place] [address]    RN Pressure Injury Documentation and I am in agreement with the RN's Pressure Assement Pressure Injury 04/14/18 Stage II -  Partial thickness loss of  dermis presenting as a shallow open ulcer with a red, pink wound bed without slough. (Active)  04/14/18 2040  Location: Sacrum  Location Orientation: Right  Staging: Stage II -  Partial thickness loss of dermis presenting as a shallow open ulcer with a red, pink wound bed without slough.  Wound Description (Comments):   Present on Admission: Yes   Estimated body mass index is 21.05 kg/m as calculated from the following:   Height as of this encounter: 5\' 8"  (1.727 m).   Weight as of this encounter: 62.8 kg.  Malnutrition Type:  Nutrition Problem: Moderate Malnutrition Etiology: chronic illness(CHF, dementia)   Malnutrition Characteristics:  Signs/Symptoms: moderate fat depletion, moderate muscle depletion   Nutrition Interventions:  Interventions: Ensure Enlive (each supplement provides 350kcal and 20 grams of protein)   Radiology Studies: US Renal  Result Date: 04/15/2018 CLINICAL DATA:  Acute kidney injury EXAM: RENAL / URINARY TRACT ULTRASOUND COMPLETE COMPARISON:  None. FINDINGS: Right Kidney: Renal measurements: 12.6 x 9.0 x 9.0 cm = volume: 532 mL. Right kidney is enlarged with heterogeneous echotexture diffusely. Echogenic shadowing foci within the hilum could reflect vascular calcifications or nonobstructing ureteral stones. Left Kidney: Renal measurements: 10.7 x 4.0 x 4.6 cm = volume: 102 ML. Echogenicity within normal limits. No mass or hydronephrosis visualized. Bladder: Appears normal for degree of bladder distention. IMPRESSION: Diffusely abnormal  appearance of the right kidney which is enlarged with diffusely heterogeneous echotexture. Cannot exclude infiltrating mass such as lymphoma or renal cell carcinoma. Consider further evaluation with MRI or CT with IV contrast. No hydronephrosis. Electronically Signed   By: Rolm Baptise M.D.   On: 04/15/2018 00:16   Dg Chest Port 1 View  Result Date: 04/14/2018 CLINICAL DATA:  Weakness.  Recent hospitalization.  Leg swelling.  EXAM: PORTABLE CHEST 1 VIEW COMPARISON:  None. FINDINGS: Cardiomegaly. The hila and mediastinum are normal. No pneumothorax. Bilateral pleural effusions with underlying opacities. No other abnormalities. IMPRESSION: Bilateral pleural effusions with underlying opacities. The underlying opacities are most likely compressive atelectasis. No other acute abnormality. Electronically Signed   By: Dorise Bullion III M.D   On: 04/14/2018 16:07   Scheduled Meds:  feeding supplement (ENSURE ENLIVE)  237 mL Oral BID BM   furosemide  40 mg Intravenous Q12H   [START ON 04/16/2018] mouth rinse  15 mL Mouth Rinse BID   sodium chloride flush  3 mL Intravenous Q12H   Continuous Infusions:  sodium chloride     ceFEPime (MAXIPIME) IV Stopped (04/15/18 0700)   metronidazole 500 mg (04/15/18 1314)    LOS: 0 days   Kerney Elbe, DO [facility] PAGER is on AMION  If 7PM-7AM, please contact night-coverage [email or web address] Password [facility] 04/15/2018, 6:56 PM

## 2018-04-15 NOTE — Progress Notes (Signed)
CRITICAL VALUE ALERT  Critical Value:  Lactic acid 2.3  Date & Time Notied:  04/15/2018 12:12 AM   Provider Notified: [place]  Orders Received/Actions taken:

## 2018-04-15 NOTE — Progress Notes (Signed)
MRSA PCR neg. Ok to [employer] per Dr. Alfredia Ferguson.  Onnie Boer, PharmD, BCIDP, AAHIVP, CPP Infectious Disease Pharmacist 04/15/2018 10:25 AM

## 2018-04-16 ENCOUNTER — Inpatient Hospital Stay (HOSPITAL_COMMUNITY): Payer: Medicare Other

## 2018-04-16 DIAGNOSIS — R7881 Bacteremia: Secondary | ICD-10-CM

## 2018-04-16 LAB — CBC WITH DIFFERENTIAL/PLATELET
Abs Immature Granulocytes: 0.2 10*3/uL — ABNORMAL HIGH (ref 0.00–0.07)
Basophils Absolute: 0 10*3/uL (ref 0.0–0.1)
Basophils Relative: 0 %
EOS ABS: 0.2 10*3/uL (ref 0.0–0.5)
Eosinophils Relative: 1 %
HCT: 29.1 % — ABNORMAL LOW (ref 36.0–46.0)
Hemoglobin: 9.2 g/dL — ABNORMAL LOW (ref 12.0–15.0)
Immature Granulocytes: 1 %
Lymphocytes Relative: 2 %
Lymphs Abs: 0.4 10*3/uL — ABNORMAL LOW (ref 0.7–4.0)
MCH: 24.1 pg — ABNORMAL LOW (ref 26.0–34.0)
MCHC: 31.6 g/dL (ref 30.0–36.0)
MCV: 76.4 fL — AB (ref 80.0–100.0)
Monocytes Absolute: 0.9 10*3/uL (ref 0.1–1.0)
Monocytes Relative: 4 %
NRBC: 0.1 % (ref 0.0–0.2)
Neutro Abs: 20.9 10*3/uL — ABNORMAL HIGH (ref 1.7–7.7)
Neutrophils Relative %: 92 %
Platelets: 400 10*3/uL (ref 150–400)
RBC: 3.81 MIL/uL — ABNORMAL LOW (ref 3.87–5.11)
RDW: 18.6 % — ABNORMAL HIGH (ref 11.5–15.5)
WBC: 22.5 10*3/uL — ABNORMAL HIGH (ref 4.0–10.5)

## 2018-04-16 LAB — BLOOD CULTURE ID PANEL (REFLEXED)
Acinetobacter baumannii: NOT DETECTED
CANDIDA GLABRATA: NOT DETECTED
Candida albicans: NOT DETECTED
Candida krusei: NOT DETECTED
Candida parapsilosis: NOT DETECTED
Candida tropicalis: NOT DETECTED
Enterobacter cloacae complex: NOT DETECTED
Enterobacteriaceae species: NOT DETECTED
Enterococcus species: NOT DETECTED
Escherichia coli: NOT DETECTED
Haemophilus influenzae: NOT DETECTED
Klebsiella oxytoca: NOT DETECTED
Klebsiella pneumoniae: NOT DETECTED
Listeria monocytogenes: NOT DETECTED
Neisseria meningitidis: NOT DETECTED
Proteus species: NOT DETECTED
Pseudomonas aeruginosa: NOT DETECTED
Serratia marcescens: NOT DETECTED
Staphylococcus aureus (BCID): NOT DETECTED
Staphylococcus species: NOT DETECTED
Streptococcus agalactiae: NOT DETECTED
Streptococcus pneumoniae: NOT DETECTED
Streptococcus pyogenes: NOT DETECTED
Streptococcus species: DETECTED — AB

## 2018-04-16 LAB — COMPREHENSIVE METABOLIC PANEL
ALT: 9 U/L (ref 0–44)
AST: 24 U/L (ref 15–41)
Albumin: 1.5 g/dL — ABNORMAL LOW (ref 3.5–5.0)
Alkaline Phosphatase: 72 U/L (ref 38–126)
Anion gap: 11 (ref 5–15)
BUN: 53 mg/dL — AB (ref 8–23)
CO2: 19 mmol/L — ABNORMAL LOW (ref 22–32)
Calcium: 9 mg/dL (ref 8.9–10.3)
Chloride: 104 mmol/L (ref 98–111)
Creatinine, Ser: 2.27 mg/dL — ABNORMAL HIGH (ref 0.44–1.00)
GFR calc Af Amer: 21 mL/min — ABNORMAL LOW (ref >60)
GFR, EST NON AFRICAN AMERICAN: 19 mL/min — AB (ref >60)
Glucose, Bld: 95 mg/dL (ref 70–99)
Potassium: 4.8 mmol/L (ref 3.5–5.1)
Sodium: 134 mmol/L — ABNORMAL LOW (ref 135–145)
Total Bilirubin: 0.7 mg/dL (ref 0.3–1.2)
Total Protein: 5.2 g/dL — ABNORMAL LOW (ref 6.5–8.1)

## 2018-04-16 LAB — MAGNESIUM: Magnesium: 1.9 mg/dL (ref 1.7–2.4)

## 2018-04-16 LAB — UREA NITROGEN, URINE: Urea Nitrogen, Ur: 861 mg/dL

## 2018-04-16 LAB — PHOSPHORUS: Phosphorus: 4.9 mg/dL — ABNORMAL HIGH (ref 2.5–4.6)

## 2018-04-16 LAB — PROCALCITONIN: PROCALCITONIN: 17.36 ng/mL

## 2018-04-16 MED ORDER — METOPROLOL TARTRATE 5 MG/5ML IV SOLN
5.0000 mg | Freq: Once | INTRAVENOUS | Status: AC
  Administered 2018-04-16: 5 mg via INTRAVENOUS
  Filled 2018-04-16: qty 5

## 2018-04-16 MED ORDER — SODIUM CHLORIDE 0.9 % IV BOLUS
250.0000 mL | Freq: Once | INTRAVENOUS | Status: AC
  Administered 2018-04-17: 250 mL via INTRAVENOUS

## 2018-04-16 MED ORDER — VANCOMYCIN HCL IN DEXTROSE 750-5 MG/150ML-% IV SOLN
750.0000 mg | INTRAVENOUS | Status: DC
  Administered 2018-04-16: 750 mg via INTRAVENOUS
  Filled 2018-04-16: qty 150

## 2018-04-16 NOTE — Progress Notes (Signed)
PHARMACY - PHYSICIAN COMMUNICATION CRITICAL VALUE ALERT - BLOOD CULTURE IDENTIFICATION (BCID)  Nicole Lowery is an 85 y.o. female who presented to [facility] on 04/14/2018 with a chief complaint of CHF exacerbation  Assessment:  1/2 blood cultures growing Streptococcus species.  Pt has been afebrile although does have elevated white count.    Name of physician (or Provider) Contacted: Dr. Alfredia Ferguson  Current antibiotics: Cefepime/Flagyl  Changes to prescribed antibiotics recommended:   Possible contamination, although white count remains elevated. Consider narrowing to Rocephin 2 g IV q24h  Results for orders placed or performed during the hospital encounter of 04/14/18  Blood Culture ID Panel (Reflexed) (Collected: 04/14/2018  7:42 PM)  Result Value Ref Range   Enterococcus species NOT DETECTED NOT DETECTED   Listeria monocytogenes NOT DETECTED NOT DETECTED   Staphylococcus species NOT DETECTED NOT DETECTED   Staphylococcus aureus (BCID) NOT DETECTED NOT DETECTED   Streptococcus species DETECTED (A) NOT DETECTED   Streptococcus agalactiae NOT DETECTED NOT DETECTED   Streptococcus pneumoniae NOT DETECTED NOT DETECTED   Streptococcus pyogenes NOT DETECTED NOT DETECTED   Acinetobacter baumannii NOT DETECTED NOT DETECTED   Enterobacteriaceae species NOT DETECTED NOT DETECTED   Enterobacter cloacae complex NOT DETECTED NOT DETECTED   Escherichia coli NOT DETECTED NOT DETECTED   Klebsiella oxytoca NOT DETECTED NOT DETECTED   Klebsiella pneumoniae NOT DETECTED NOT DETECTED   Proteus species NOT DETECTED NOT DETECTED   Serratia marcescens NOT DETECTED NOT DETECTED   Haemophilus influenzae NOT DETECTED NOT DETECTED   Neisseria meningitidis NOT DETECTED NOT DETECTED   Pseudomonas aeruginosa NOT DETECTED NOT DETECTED   Candida albicans NOT DETECTED NOT DETECTED   Candida glabrata NOT DETECTED NOT DETECTED   Candida krusei NOT DETECTED NOT DETECTED   Candida parapsilosis NOT DETECTED NOT  DETECTED   Candida tropicalis NOT DETECTED NOT DETECTED    Caryl Pina 04/16/2018  6:11 AM

## 2018-04-16 NOTE — Progress Notes (Signed)
Pharmacy Antibiotic Note  Nicole Lowery is a 85 y.o. female on day # 2 antibiotics for sepsis and empiric coverage. Continues on Cefepime and Flagyl. Vancomycin stopped on 3/16 with MRSA PCR negative, but pharmacy has been consulted to resume Vancomycin dosing for bacteremia.  BCID grew Strep species in 1 of 2 cultures.  WBC 22.5, LA 3.1> 2.5, PCT 4.39> 17.36, Scr trended up some.      Vancomycin 1250 mg IV given ~2am on 3/16.  Plan:  Continue Vancomycin with 750 mg IV q48hrs.  Will begin tonight ~10pm  Goal AUC 400-550.  Expected AUC: 455  SCr used: 2.27  Continue Cefepime 1gm IV q24hrs.  Also on Flagyl 500 mg IV q8hrs.  Follow renal function, final culture data, progress and antibiotic plans.  Height: 5\' 8"  (172.7 cm) Weight: 138 lb 7.2 oz (62.8 kg) IBW/kg (Calculated) : 63.9  Temp (24hrs), Avg:98.2 F (36.8 C), Min:97.8 F (36.6 C), Max:98.8 F (37.1 C)  Recent Labs  Lab 04/14/18 1612 04/14/18 1836 04/14/18 1957 04/15/18 0133 04/15/18 0528 04/16/18 0224  WBC 23.6*  --   --  21.7*  --  22.5*  CREATININE 2.00*  --   --  1.97*  --  2.27*  LATICACIDVEN  --  2.4* 2.3* 3.1* 2.5*  --     Estimated Creatinine Clearance: 16.7 mL/min (A) (by C-G formula based on SCr of 2.27 mg/dL (H)).    No Known Allergies  Antimicrobials this admission: flagyl 3/16 >> vancomycin 3/16 x 1; resume 3/17 >> cefepime 3/16 >>  Dose adjustments this admission:  n/a  Microbiology results: 3/15 respiratory panel negative 3/15 MRSA PCR negative 3/15 urine - negative 3/15 blood x 2 - GPC in 1 of 2 bottles, Strep species per BCID. No growth x 2 days in the other bottle  Thank you for allowing pharmacy to be a part of this patient's care.  Arty Baumgartner, [place] Pager: [phone] or phone: [phone] 04/16/2018 2:24 PM

## 2018-04-16 NOTE — Progress Notes (Signed)
PROGRESS NOTE    Nicole Lowery  IRJ:[id number] DOB: 1926-06-05 DOA: 04/14/2018 PCP: System, Pcp Not In   Brief Narrative:  HPI per Dr. Mitzi Hansen on 04/14/2018 HPI: Nicole Lowery is a 85 y.o. female with medical history significant for dementia, admitted to the hospital in February with atrial fibrillation and acute CHF, now presenting to the ED for evaluation of progressive generalized weakness and lower extremity edema.  Patient is accompanied by her son and daughter-in-law who assist with the history.  She had reportedly been doing fairly well until last month when she became weak and short of breath, and she was admitted to [facility] in [facility] where she was diuresed and noted to be in atrial fibrillation.  She reportedly had an echocardiogram performed during that admission, results not available at this time, and she was discharged on metoprolol.  She followed up with her PCP who gave her 3 additional days of Lasix and with a cardiologist to had her wear an ambulatory monitor that showed PACs, but no atrial fibrillation.  She continued to do fairly well at home with family until insidiously worsening weakness and lower extremity edema over the past 2 weeks.  She is no longer able to wear her shoes due to swelling and is unable to ascend 3 steps to their house without significant dyspnea.  She has not complained of anything, but family reports that she rarely does.  She has not been noted to be coughing much.  Family is unaware of any history of kidney disease.  **Interim History Cardiology consulted for further evaluation recommendations and patient is continue be diuresed.  IV vancomycin was discontinued given MRSA PCR negative but resumed due to patient worsening clinically with elevated PCT, and Leukocytosis. Has A renal Mass noted so foley will be placed and discussed case with Urology who recommends imaging in 48 hours with CT if Cr is improved.  Cardiology evaluated and patient now has a Atypical Atrial Flutter and was started on Metoprolol 25 mg po BID. Since patient appears to be clinically deteriorating Palliative Care consulted for [place] Discussion and this is still pending.   Assessment & Plan:   Principal Problem:   Acute respiratory failure with hypoxia (HCC) Active Problems:   Renal insufficiency   Normocytic anemia   Unspecified atrial fibrillation (HCC)   SIRS (systemic inflammatory response syndrome) (HCC)   Dementia (HCC)   Hypervolemia   Pleural effusion   Protein calorie malnutrition (HCC)   Pressure injury of skin   Malnutrition of moderate degree  Acute respiratory failure with hypoxia in the setting of Left Pneumonia -Presented with progressive generalized weakness and bilateral leg edema, found to be saturating in mid-80's while at rest  -She has bilateral pleural effusions on CXR with underlying opacities felt to reflect compressive atelectasis  -Respiratory virus panel is Negative -Likely secondary to CHF but has some component of Infection; had echo last month at outside hospital, will try to get records  -Continue supplemental O2, follow-up cultures and resp viral panel, diurese as below   -Repeat CXR this AM showed Nodular densities at the lateral aspect of the left lung. Further evaluation with chest CT recommended to exclude malignancy. Increasing opacity on the left, may reflect developing infection versus layering pleural effusions. Bilateral, left greater than right pleural effusion -Repeat CXR in AM   Acute CHF (Unknown Type at this point) -Presents with increasing gen weakness and bilateral LE edema, found to be saturating mid-80's on rm air  while at rest  -She was hospitalized in February, diuresed, and had echocardiogram with results not available  -BNP on Admission was 652.9 -Obtain medical records from recent admission in [place], [place]   -Continue cardiac monitoring, diurese with Lasix 40 mg IV  q12h as BP allows with close monitoring of renal function and electrolytes but will stop for now.  -Cardiology consulted for further evaluation and recommendations    -Strict I's/O's, Daily Weights; Patient is + 5 mL since admission -Foley for Monitoring   Sepsis in the setting of Pneumonia and ?Strep Bacteremia - Presents with generalized weakness and bilateral LE edema   - Found to have leukocytosis to 23,600 with elevated lactate, mild tachypnea and hypoxia, temp 37.7 C, and not tachycardic on metoprolol   -Lactic Acid trended down to 2.5 and WBC was down to 21,7000  -Respiratory Virus Panel was Negative  -Blood and urine cultures collected in ED and empiric antiboitics started; - Continued antibiotics for now while following cultures and clinical course and D/C'd IV Vancomycin given MRSA Negative but resumed today given clinical worsening  -PCT was 4.39 and worsened to 17.36 -CXR showed "Bilateral pleural effusions with underlying opacities. The underlying opacities are most likely compressive atelectasis. No other acute abnormality." but ? If Actually has PNA -Repeat CXR in AM after Diuresis and it showed increasing Opacity on the Left -Follow Blood and Urine Cx's; Blood Cx 1/2 showed Strep Species -Repeat Blood Cx's  -Restarted Vancomycin  -Cardiology recommending Against TEE at this point if Palliative Measures are pursued   Multifocal Atrial Tachycardia and now Atypical Atrial Flutter - Appeared to be in rate-controlled atrial fibrillation on admission but Cardiology corrected and stated she had no definite A Fib here  -She saw cardiology in [place], [place] after recent hospitalization, had ambulatory heart monitor, and was told she had frequent PAC's but not a fib on monitoring  -CHADS-VASc at least 53 (age x2, gender) and she likely has CHF  - She is not on anticoagulation; with Hgb only 8.8 and unknown baseline, and given report of no a fib on ambulatory monitoring, will hold off on  anticoagulation now but Cardiology recommending it based on [place] discussion. If she does not become [facility] Cardiology recommending it  -Continue cardiac monitoring, Treat respiratory illness and SIRS, -Checked TSH and was 1.816 and Mag Level was not done so will obtain in AM   Renal Insufficiency on CKD; Worsening  -SCr is 2.00 on admission with no prior labs available  -BUN and creatinine was 43/1.97 and worsened to 53/2.27 -She is hypervolemic on admission but has low albumin as well -Renally-dose medications  -Check urine chemistries (Urine Na+ was 15 and Urine Cr was 198) and renal US; Renal U/S showed Diffusely abnormal appearance of the right kidney which is enlarged with diffusely heterogeneous echotexture. Cannot exclude infiltrating mass such as lymphoma or renal cell carcinoma. No hydronephrosis -Analysis showed hazy appearance with amber color urine, small hemoglobin, negative nitrites, negative ketones, small leukocytes, rare bacteria, 100 protein, 6-10 WBCs -Continue to Monitor and Trend and follow daily CMP during diuresis   -IV Lasix now discontinued  -Will Place Foley for Accurate I's/O's and Decompression to see if Cr comes down. After discussion with Urology if Cr trends down after 48 hours recommending Imaging with CT and if not then obtaining MRI Abdomen w/wo Contrast   Suspected Renal Right Kidney Mass -Check MRI of the Abdomen and Pelvis without contrast given her renal function -Cannot do CT scan as her  creatinine is 2 -Discussed with Urology Dr. Natividad Brood and he is recommending Placing Foley for now and watching Cr to see if CT can be done -He reviewed Films and feels that it is very odd for Renal Cell Carcinoma to look like that -Continue to Monitor and evaluate in a few days  -Palliative Care Consulted   Normocytic Anemia  -Hgb is 8.8 on admission with no priors available for comparison -Now Hgb/Hct is 9.6/31.8 and trended down to 9.2/29.1 -No melena or hematochezia  has been noted  -Checked Anemia Panel and showed iron level of 7, U IBC of 118, TIBC of 125, saturation ratios of 6%, ferritin level 1019, folate level 5.1, and vitamin B12 level of 4847 -Continue to monitor for signs and symptoms of bleeding -Repeat CBC in the a.m.  Moderate (Non-Severe) Protein-calorie malnutrition  in the context of Chronic Illness -Patient is frail with albumin only 1.6  -Dietary consultation, check prealbumin and was <5  -Nutritionist recommending Ensure Enlive p.o. twice daily we will continue  DVT prophylaxis: SCDs  Code Status: FULL CODE Family Communication: Discussed with Son at bedside  Disposition Plan: Remain Inpatient for diuresis and workup  Consultants:   Cardiology  Discussed Case with Urology  Palliative Care Consult   Procedures: Renal U/S   Antimicrobials:  Anti-infectives (From admission, onward)   Start     Dose/Rate Route Frequency Ordered Stop   04/16/18 2200  vancomycin (VANCOCIN) IVPB 750 mg/150 ml premix     750 mg 150 mL/hr over 60 Minutes Intravenous Every 48 hours 04/16/18 1422     04/14/18 2000  metroNIDAZOLE (FLAGYL) IVPB 500 mg     500 mg 100 mL/hr over 60 Minutes Intravenous Every 8 hours 04/14/18 1929     04/14/18 1945  vancomycin (VANCOCIN) 1,250 mg in sodium chloride 0.9 % 250 mL IVPB     1,250 mg 166.7 mL/hr over 90 Minutes Intravenous  Once 04/14/18 1943 04/15/18 0700   04/14/18 1945  ceFEPIme (MAXIPIME) 1 g in sodium chloride 0.9 % 100 mL IVPB     1 g 200 mL/hr over 30 Minutes Intravenous Every 24 hours 04/14/18 1943     04/14/18 1942  vancomycin variable dose per unstable renal function (pharmacist dosing)  Status:  Discontinued      Does not apply See admin instructions 04/14/18 1943 04/15/18 1015   04/14/18 1930  ceFEPIme (MAXIPIME) 2 g in sodium chloride 0.9 % 100 mL IVPB  Status:  Discontinued     2 g 200 mL/hr over 30 Minutes Intravenous  Once 04/14/18 1929 04/14/18 1946   04/14/18 1930  vancomycin  (VANCOCIN) IVPB 1000 mg/200 mL premix  Status:  Discontinued     1,000 mg 200 mL/hr over 60 Minutes Intravenous  Once 04/14/18 1929 04/14/18 1945   04/14/18 1915  cefTRIAXone (ROCEPHIN) 1 g in sodium chloride 0.9 % 100 mL IVPB  Status:  Discontinued     1 g 200 mL/hr over 30 Minutes Intravenous  Once 04/14/18 1903 04/14/18 1925   04/14/18 1915  azithromycin (ZITHROMAX) 500 mg in sodium chloride 0.9 % 250 mL IVPB  Status:  Discontinued     500 mg 250 mL/hr over 60 Minutes Intravenous  Once 04/14/18 1903 04/14/18 1925     Subjective: Seen and Examined at bedside again was very fatigued appearing insistent shortness of breath and not really changed.  No nausea or vomiting but not feel well and appeared very depressed.  Son is open to goals of care discussion  and palliative has been consulted.  No other concerns or complaints at this time.  Objective: Vitals:   04/16/18 2010 04/16/18 2020 04/16/18 2030 04/16/18 2040  BP:      Pulse:      Resp:      Temp:      TempSrc:      SpO2: 99% 99% 99% 99%  Weight:      Height:        Intake/Output Summary (Last 24 hours) at 04/16/2018 2042 Last data filed at 04/16/2018 1600 Gross per 24 hour  Intake 400 ml  Output 450 ml  Net -50 ml   Filed Weights   04/14/18 1537 04/14/18 2045 04/15/18 0500  Weight: 59 kg 60.5 kg 62.8 kg   Examination: Physical Exam:  Constitutional: Thin frail elderly Caucasian female currently no acute distress appears calm but uncomfortable and fatigued appearing Eyes: Lids and conjunctive are normal.  Sclera anicteric ENMT: External ears and nose appear normal.  Grossly normal hearing.  Mucous members are moist Neck: Appears supple no JVD Respiratory: Diminished auscultation bilaterally with coarse breath sounds and crackles more so on the left with slight rhonchi.  Normal respiratory effort and she is not tachypneic but she is wearing supplemental oxygen via nasal cannula Cardiovascular: Irregularly irregular.  Has  3+ lower extremity edema Abdomen: Soft, nontender, slightly tender to palpate.  Bowel sounds present GU: Deferred Musculoskeletal: No contractures or cyanosis.  No joint deformities in upper extremities Skin: No appreciable rashes or lesions on physical evaluation Neurologic: Cranial nerves II through XII grossly intact no appreciable focal deficits.  Romberg sign cerebellar reflexes were not assessed Psychiatric: Impaired judgment and insight.  Patient is awake and alert and oriented x1.  Appears anxious and depressed appearing  Data Reviewed: I have personally reviewed following labs and imaging studies  CBC: Recent Labs  Lab 04/14/18 1612 04/15/18 0133 04/16/18 0224  WBC 23.6* 21.7* 22.5*  NEUTROABS 23.1* 20.1* 20.9*  HGB 8.8* 9.6* 9.2*  HCT 28.6* 31.8* 29.1*  MCV 81.7 79.9* 76.4*  PLT 430* 483* 923   Basic Metabolic Panel: Recent Labs  Lab 04/14/18 1612 04/15/18 0133 04/16/18 0224  NA 136 135 134*  K 5.1 5.1 4.8  CL 107 105 104  CO2 18* 17* 19*  GLUCOSE 89 103* 95  BUN 38* 43* 53*  CREATININE 2.00* 1.97* 2.27*  CALCIUM 9.3 9.4 9.0  MG  --   --  1.9  PHOS  --   --  4.9*   GFR: Estimated Creatinine Clearance: 16.7 mL/min (A) (by C-G formula based on SCr of 2.27 mg/dL (H)). Liver Function Tests: Recent Labs  Lab 04/14/18 1612 04/15/18 0133 04/16/18 0224  AST 29 28 24   ALT 9 10 9   ALKPHOS 76 85 72  BILITOT 0.4 0.8 0.7  PROT 5.4* 5.8* 5.2*  ALBUMIN 1.6* 1.7* 1.5*   No results for input(s): LIPASE, AMYLASE in the last 168 hours. No results for input(s): AMMONIA in the last 168 hours. Coagulation Profile: Recent Labs  Lab 04/14/18 1952  INR 1.2   Cardiac Enzymes: No results for input(s): CKTOTAL, CKMB, CKMBINDEX, TROPONINI in the last 168 hours. BNP (last 3 results) No results for input(s): PROBNP in the last 8760 hours. HbA1C: No results for input(s): HGBA1C in the last 72 hours. CBG: No results for input(s): GLUCAP in the last 168 hours. Lipid  Profile: No results for input(s): CHOL, HDL, LDLCALC, TRIG, CHOLHDL, LDLDIRECT in the last 72 hours. Thyroid Function Tests: Recent Labs  04/15/18 0133  TSH 1.816   Anemia Panel: Recent Labs    04/15/18 0133  VITAMINB12 4,847*  FOLATE 5.1*  FERRITIN 1,019*  TIBC 125*  IRON 7*  RETICCTPCT 1.7   Sepsis Labs: Recent Labs  Lab 04/14/18 1836 04/14/18 1952 04/14/18 1957 04/15/18 0133 04/15/18 0528 04/16/18 0825  PROCALCITON  --  4.39  --   --   --  17.36  LATICACIDVEN 2.4*  --  2.3* 3.1* 2.5*  --     Recent Results (from the past 240 hour(s))  Urine culture     Status: None   Collection Time: 04/14/18  5:48 PM  Result Value Ref Range Status   Specimen Description URINE, CATHETERIZED  Final   Special Requests NONE  Final   Culture   Final    NO GROWTH Performed at [facility], [place] [address], [place], [place] [address]    Report Status 04/15/2018 FINAL  Final  Blood culture (routine x 2)     Status: None (Preliminary result)   Collection Time: 04/14/18  7:33 PM  Result Value Ref Range Status   Specimen Description BLOOD RIGHT ANTECUBITAL  Final   Special Requests   Final    BOTTLES DRAWN AEROBIC AND ANAEROBIC Blood Culture adequate volume   Culture   Final    NO GROWTH 2 DAYS Performed at [facility], [address] [place], [place] [address]    Report Status PENDING  Incomplete  Blood culture (routine x 2)     Status: None (Preliminary result)   Collection Time: 04/14/18  7:42 PM  Result Value Ref Range Status   Specimen Description BLOOD LEFT ANTECUBITAL  Final   Special Requests   Final    BOTTLES DRAWN AEROBIC AND ANAEROBIC Blood Culture adequate volume   Culture  Setup Time   Final    GRAM POSITIVE COCCI ANAEROBIC BOTTLE ONLY Organism ID to follow CRITICAL RESULT CALLED TO, READ BACK BY AND VERIFIED WITH: G.ABBOTT,PHARMD [id number] 04/26/2018 M.CAMPBELL Performed at [facility], [place] [address] [place], [place] [address]    Culture  GRAM POSITIVE COCCI  Final   Report Status PENDING  Incomplete  Blood Culture ID Panel (Reflexed)     Status: Abnormal   Collection Time: 04/14/18  7:42 PM  Result Value Ref Range Status   Enterococcus species NOT DETECTED NOT DETECTED Final   Listeria monocytogenes NOT DETECTED NOT DETECTED Final   Staphylococcus species NOT DETECTED NOT DETECTED Final   Staphylococcus aureus (BCID) NOT DETECTED NOT DETECTED Final   Streptococcus species DETECTED (A) NOT DETECTED Final    Comment: Not Enterococcus species, Streptococcus agalactiae, Streptococcus pyogenes, or Streptococcus pneumoniae. CRITICAL RESULT CALLED TO, READ BACK BY AND VERIFIED WITH: G.ABBOTT,PHARMD [id number] 04/26/2018 M.CAMPBELL    Streptococcus agalactiae NOT DETECTED NOT DETECTED Final   Streptococcus pneumoniae NOT DETECTED NOT DETECTED Final   Streptococcus pyogenes NOT DETECTED NOT DETECTED Final   Acinetobacter baumannii NOT DETECTED NOT DETECTED Final   Enterobacteriaceae species NOT DETECTED NOT DETECTED Final   Enterobacter cloacae complex NOT DETECTED NOT DETECTED Final   Escherichia coli NOT DETECTED NOT DETECTED Final   Klebsiella oxytoca NOT DETECTED NOT DETECTED Final   Klebsiella pneumoniae NOT DETECTED NOT DETECTED Final   Proteus species NOT DETECTED NOT DETECTED Final   Serratia marcescens NOT DETECTED NOT DETECTED Final   Haemophilus influenzae NOT DETECTED NOT DETECTED Final   Neisseria meningitidis NOT DETECTED NOT DETECTED Final   Pseudomonas aeruginosa NOT DETECTED  NOT DETECTED Final   Candida albicans NOT DETECTED NOT DETECTED Final   Candida glabrata NOT DETECTED NOT DETECTED Final   Candida krusei NOT DETECTED NOT DETECTED Final   Candida parapsilosis NOT DETECTED NOT DETECTED Final   Candida tropicalis NOT DETECTED NOT DETECTED Final    Comment: Performed at [facility], [place] [address], [place], [place] [address]  MRSA PCR Screening     Status: None   Collection Time: 04/14/18  9:47 PM    Result Value Ref Range Status   MRSA by PCR NEGATIVE NEGATIVE Final    Comment:        The GeneXpert MRSA Assay (FDA approved for NASAL specimens only), is one component of a comprehensive MRSA colonization surveillance program. It is not intended to diagnose MRSA infection nor to guide or monitor treatment for MRSA infections. Performed at [facility], [place] [address], [place], [place] [address]   Respiratory Panel by PCR     Status: None   Collection Time: 04/14/18 10:39 PM  Result Value Ref Range Status   Adenovirus NOT DETECTED NOT DETECTED Final   Coronavirus 229E NOT DETECTED NOT DETECTED Final    Comment: (NOTE) The Coronavirus on the Respiratory Panel, DOES NOT test for the novel  Coronavirus (2019 nCoV)    Coronavirus HKU1 NOT DETECTED NOT DETECTED Final   Coronavirus NL63 NOT DETECTED NOT DETECTED Final   Coronavirus OC43 NOT DETECTED NOT DETECTED Final   Metapneumovirus NOT DETECTED NOT DETECTED Final   Rhinovirus / Enterovirus NOT DETECTED NOT DETECTED Final   Influenza A NOT DETECTED NOT DETECTED Final   Influenza B NOT DETECTED NOT DETECTED Final   Parainfluenza Virus 1 NOT DETECTED NOT DETECTED Final   Parainfluenza Virus 2 NOT DETECTED NOT DETECTED Final   Parainfluenza Virus 3 NOT DETECTED NOT DETECTED Final   Parainfluenza Virus 4 NOT DETECTED NOT DETECTED Final   Respiratory Syncytial Virus NOT DETECTED NOT DETECTED Final   Bordetella pertussis NOT DETECTED NOT DETECTED Final   Chlamydophila pneumoniae NOT DETECTED NOT DETECTED Final   Mycoplasma pneumoniae NOT DETECTED NOT DETECTED Final    Comment: Performed at [facility], [place]. [address] [place], [place] [address]    RN Pressure Injury Documentation and I am in agreement with the RN's Pressure Assement Pressure Injury 04/14/18 Stage II -  Partial thickness loss of dermis presenting as a shallow open ulcer with a red, pink wound bed without slough. (Active)  04/14/18 2040  Location:  [place]  Location Orientation: Right  Staging: Stage II -  Partial thickness loss of dermis presenting as a shallow open ulcer with a red, pink wound bed without slough.  Wound Description (Comments):   Present on Admission: Yes   Estimated body mass index is 21.05 kg/m as calculated from the following:   Height as of this encounter: 5\' 8"  (1.727 m).   Weight as of this encounter: 62.8 kg.  Malnutrition Type:  Nutrition Problem: Moderate Malnutrition Etiology: chronic illness(CHF, dementia)   Malnutrition Characteristics:  Signs/Symptoms: moderate fat depletion, moderate muscle depletion   Nutrition Interventions:  Interventions: Ensure Enlive (each supplement provides 350kcal and 20 grams of protein)   Radiology Studies: US Renal  Result Date: 04/15/2018 CLINICAL DATA:  Acute kidney injury EXAM: RENAL / URINARY TRACT ULTRASOUND COMPLETE COMPARISON:  None. FINDINGS: Right Kidney: Renal measurements: 12.6 x 9.0 x 9.0 cm = volume: 532 mL. Right kidney is enlarged with heterogeneous echotexture diffusely. Echogenic shadowing foci within the  hilum could reflect vascular calcifications or nonobstructing ureteral stones. Left Kidney: Renal measurements: 10.7 x 4.0 x 4.6 cm = volume: 102 ML. Echogenicity within normal limits. No mass or hydronephrosis visualized. Bladder: Appears normal for degree of bladder distention. IMPRESSION: Diffusely abnormal appearance of the right kidney which is enlarged with diffusely heterogeneous echotexture. Cannot exclude infiltrating mass such as lymphoma or renal cell carcinoma. Consider further evaluation with MRI or CT with IV contrast. No hydronephrosis. Electronically Signed   By: Rolm Baptise M.D.   On: 04/15/2018 00:16   Dg Chest Port 1 View  Result Date: 04/16/2018 CLINICAL DATA:  85 year old female with shortness of breath EXAM: PORTABLE CHEST 1 VIEW COMPARISON:  04/14/2018 FINDINGS: Cardiomediastinal silhouette unchanged. Increasing opacity in the  left lung with obscuration of the left hemidiaphragm and the left heart border. Blunting of the right costophrenic angle and left costophrenic angle. No pneumothorax. Nodular densities at the lateral aspect of the left lung. IMPRESSION: Nodular densities at the lateral aspect of the left lung. Further evaluation with chest CT recommended to exclude malignancy. Increasing opacity on the left, may reflect developing infection versus layering pleural effusions. Bilateral, left greater than right pleural effusion. Electronically Signed   By: Corrie Mckusick D.O.   On: 04/16/2018 09:07   Scheduled Meds:  feeding supplement (ENSURE ENLIVE)  237 mL Oral BID BM   furosemide  40 mg Intravenous Q12H   mouth rinse  15 mL Mouth Rinse BID   sodium chloride flush  3 mL Intravenous Q12H   Continuous Infusions:  sodium chloride     ceFEPime (MAXIPIME) IV 1 g (04/15/18 1935)   metronidazole 500 mg (04/16/18 1236)   vancomycin      LOS: 1 day   Kerney Elbe, DO [facility] PAGER is on AMION  If 7PM-7AM, please contact night-coverage [email or web address] Password [facility] 04/16/2018, 8:42 PM

## 2018-04-16 NOTE — Progress Notes (Signed)
Subjective:  Denies any complaints today.  Objective:  Vital Signs in the last 24 hours: Temp:  [97.8 F (36.6 C)-98.8 F (37.1 C)] 98 F (36.7 C) (03/17 0700) Pulse Rate:  [105-130] 123 (03/17 0700) Resp:  [12-20] 20 (03/17 0700) BP: (99-131)/(64-73) 99/64 (03/17 0700) SpO2:  [96 %-100 %] 96 % (03/17 0700)  Intake/Output from previous day: 03/16 0701 - 03/17 0700 In: 200 [IV Piggyback:200] Out: 695 [Urine:690; Stool:5]  Physical Exam Constitutional:  Appears cachectic Patient is able to lay flat comfortably without significant orthopnea  HENT:  Head: Normocephalic and atraumatic.  Eyes: Pupils are equal, round, and reactive to light. Conjunctivae are normal.  Neck: No JVD present.  Cardiovascular: Regular rhythm. Frequent extrasystoles are present. Tachycardia present.  Distal pulses not palpable due to edema  Pulmonary/Chest: Effort normal.  B/l breast sounds diminished at bases  Abdominal: Soft. Bowel sounds are normal. There is no abdominal tenderness.  Musculoskeletal:        General: Edema (1+ b/l) present.  Skin: Skin is warm.  Nursing note and vitals reviewed.  Lab Results: BMP Recent Labs    04/14/18 1612 04/15/18 0133 04/16/18 0224  NA 136 135 134*  K 5.1 5.1 4.8  CL 107 105 104  CO2 18* 17* 19*  GLUCOSE 89 103* 95  BUN 38* 43* 53*  CREATININE 2.00* 1.97* 2.27*  CALCIUM 9.3 9.4 9.0  GFRNONAA 22* 22* 19*  GFRAA 25* 25* 21*    CBC Recent Labs  Lab 04/16/18 0224  WBC 22.5*  RBC 3.81*  HGB 9.2*  HCT 29.1*  PLT 400  MCV 76.4*  MCH 24.1*  MCHC 31.6  RDW 18.6*  LYMPHSABS 0.4*  MONOABS 0.9  EOSABS 0.2  BASOSABS 0.0    HEMOGLOBIN A1C No results found for: HGBA1C, MPG  Cardiac Panel (last 3 results) No results for input(s): CKTOTAL, CKMB, TROPONINI, RELINDX in the last 8760 hours.  BNP (last 3 results) Recent Labs    04/14/18 1612  BNP 652.9*    TSH Recent Labs    04/15/18 0133  TSH 1.816    Lipid Panel  No results found  for: CHOL, TRIG, HDL, CHOLHDL, VLDL, LDLCALC, LDLDIRECT   Hepatic Function Panel Recent Labs    04/14/18 1612 04/15/18 0133 04/16/18 0224  PROT 5.4* 5.8* 5.2*  ALBUMIN 1.6* 1.7* 1.5*  AST 29 28 24   ALT 9 10 9   ALKPHOS 76 85 72  BILITOT 0.4 0.8 0.7    CARDIAC STUDIES:  EKG 04/15/2018: Multifactorial atrial tachycardia. Low voltage.   Echocardiogram: Please obtain records from [facility] in [place]  Assessment & Recommendations:  85 y.o. caucasian female  with hypertension, progressive dementia, now admitted with progressive generalized weakness and leg edema.  Leg edema: While she may have a component of heart failure-as suggested by elevated BNP, I suspect her leg edema is at least partially contributed by her protein calorie malnutrition- as suggested by cachexia and hypoalbuminemia.She is able to lay flat without any significant orthopnea.  Agree with cautious use of lasix. Please obtain records from [facility] re: recent echocardiogram. Overall, given her advanced age and comorbidities, her long term prognosis remains guarded. Family aware and agree with conservative line of management.  Tachycardia: Atypical atrial flutter on telemetry today. Consider metoprolol 25 mg bid. CHA2DS2VASc score of at least 3, moderate stroke risk. Consider anticoagulation. However, if palliative care is being pursued, reasonable to forego the above recommendations. She is comfortable and asymptomatic from the tachycardia  itself.   Bacteremia: As above, if palliative care is being pursued, no indication for for TEE.   Nigel Mormon, M.D. 04/16/2018, 1:38 PM [facility] [place] Pager: [phone] Office: [phone] If no answer: [phone]

## 2018-04-16 NOTE — Plan of Care (Signed)
Discussed with patient in front of son plan of care for the evening, pain management and son's concern of patient not being on any heart rate medication at this time with some teach back displayed.

## 2018-04-17 ENCOUNTER — Inpatient Hospital Stay (HOSPITAL_COMMUNITY): Payer: Medicare Other

## 2018-04-17 DIAGNOSIS — F0280 Dementia in other diseases classified elsewhere without behavioral disturbance: Secondary | ICD-10-CM

## 2018-04-17 DIAGNOSIS — Z515 Encounter for palliative care: Secondary | ICD-10-CM

## 2018-04-17 DIAGNOSIS — N179 Acute kidney failure, unspecified: Secondary | ICD-10-CM

## 2018-04-17 LAB — COMPREHENSIVE METABOLIC PANEL
ALK PHOS: 98 U/L (ref 38–126)
ALT: 9 U/L (ref 0–44)
AST: 23 U/L (ref 15–41)
Albumin: 1.3 g/dL — ABNORMAL LOW (ref 3.5–5.0)
Anion gap: 11 (ref 5–15)
BUN: 65 mg/dL — ABNORMAL HIGH (ref 8–23)
CALCIUM: 8.8 mg/dL — AB (ref 8.9–10.3)
CO2: 20 mmol/L — ABNORMAL LOW (ref 22–32)
Chloride: 104 mmol/L (ref 98–111)
Creatinine, Ser: 2.48 mg/dL — ABNORMAL HIGH (ref 0.44–1.00)
GFR calc Af Amer: 19 mL/min — ABNORMAL LOW (ref >60)
GFR calc non Af Amer: 17 mL/min — ABNORMAL LOW (ref >60)
Glucose, Bld: 90 mg/dL (ref 70–99)
Potassium: 4.7 mmol/L (ref 3.5–5.1)
Sodium: 135 mmol/L (ref 135–145)
Total Bilirubin: 0.5 mg/dL (ref 0.3–1.2)
Total Protein: 4.7 g/dL — ABNORMAL LOW (ref 6.5–8.1)

## 2018-04-17 LAB — CULTURE, BLOOD (ROUTINE X 2): Special Requests: ADEQUATE

## 2018-04-17 LAB — CBC WITH DIFFERENTIAL/PLATELET
Abs Immature Granulocytes: 0.29 10*3/uL — ABNORMAL HIGH (ref 0.00–0.07)
Basophils Absolute: 0 10*3/uL (ref 0.0–0.1)
Basophils Relative: 0 %
Eosinophils Absolute: 0 10*3/uL (ref 0.0–0.5)
Eosinophils Relative: 0 %
HCT: 25.6 % — ABNORMAL LOW (ref 36.0–46.0)
HEMOGLOBIN: 8.2 g/dL — AB (ref 12.0–15.0)
Immature Granulocytes: 2 %
LYMPHS PCT: 3 %
Lymphs Abs: 0.5 10*3/uL — ABNORMAL LOW (ref 0.7–4.0)
MCH: 24.9 pg — ABNORMAL LOW (ref 26.0–34.0)
MCHC: 32 g/dL (ref 30.0–36.0)
MCV: 77.8 fL — ABNORMAL LOW (ref 80.0–100.0)
Monocytes Absolute: 1.1 10*3/uL — ABNORMAL HIGH (ref 0.1–1.0)
Monocytes Relative: 6 %
Neutro Abs: 17.9 10*3/uL — ABNORMAL HIGH (ref 1.7–7.7)
Neutrophils Relative %: 89 %
Platelets: 410 10*3/uL — ABNORMAL HIGH (ref 150–400)
RBC: 3.29 MIL/uL — ABNORMAL LOW (ref 3.87–5.11)
RDW: 19 % — ABNORMAL HIGH (ref 11.5–15.5)
WBC: 19.8 10*3/uL — ABNORMAL HIGH (ref 4.0–10.5)
nRBC: 0 % (ref 0.0–0.2)

## 2018-04-17 LAB — PHOSPHORUS: Phosphorus: 4.6 mg/dL (ref 2.5–4.6)

## 2018-04-17 LAB — PROCALCITONIN: Procalcitonin: 15.51 ng/mL

## 2018-04-17 LAB — MAGNESIUM: MAGNESIUM: 2 mg/dL (ref 1.7–2.4)

## 2018-04-17 MED ORDER — METOPROLOL TARTRATE 5 MG/5ML IV SOLN
5.0000 mg | Freq: Four times a day (QID) | INTRAVENOUS | Status: DC
  Administered 2018-04-17 – 2018-04-18 (×5): 5 mg via INTRAVENOUS
  Filled 2018-04-17 (×5): qty 5

## 2018-04-17 MED ORDER — METOPROLOL TARTRATE 5 MG/5ML IV SOLN
5.0000 mg | Freq: Once | INTRAVENOUS | Status: AC
  Administered 2018-04-17: 5 mg via INTRAVENOUS
  Filled 2018-04-17: qty 5

## 2018-04-17 MED ORDER — METRONIDAZOLE IN NACL 5-0.79 MG/ML-% IV SOLN
500.0000 mg | Freq: Three times a day (TID) | INTRAVENOUS | Status: DC
  Administered 2018-04-17 – 2018-04-19 (×7): 500 mg via INTRAVENOUS
  Filled 2018-04-17 (×7): qty 100

## 2018-04-17 NOTE — Progress Notes (Addendum)
PROGRESS NOTE    Nicole Lowery   UUV:[id number]  DOB: 10-08-1926  DOA: 04/14/2018 PCP: System, Pcp Not In   Brief Narrative:  Nicole Lowery is a 85 y.o.femalewith medical history significant fordementia, admitted to the hospital in February with atrial fibrillation and acute CHF, now presenting to the ED for evaluation of progressive loss of appetite, generalized weakness and lower extremity edema. The patient was admitted and [facility] for pedal edema in February. According to her son, she has been having edema of her lower extremity beginning in February and she did improve after discharge in February however her legs have swollen up again worse than before and she is no longer able to put her shoes on. Patient had been losing weight as well but this eventually stabilized even though oral intake was not great.  For the past 2 weeks her oral intake has declined suddenly again.   In the ED, pulse ox noted to be 80% on room air, WBC count 23.6, albumin 1.6, BUN 38 and creatinine 2.0.  Lower extremities were quite edematous and she was oriented only to place and person. Chest x-ray revealed bilateral pleural effusions and compressive atelectasis.  She was also noted to have atrial fibrillation which is a new diagnosis for her.  Subjective: Sleepy, no complaints.  Per son she did not eat much this morning.    Assessment & Plan:   Principal Problem:   Acute respiratory failure with hypoxia  -Secondary to pleural effusions and compressive atelectasis - resp virus panel is negative, MRSA PCR neg-   - has been given Lasix and some improvement in effusions noted -She does not have much congestive heart failure and I suspect most that this is as a result of malnutrition- Cr getting worse with Lasix without much improvement in edema  Active Problems:  Strep viridans in 1/ 4 bottles of blood cultures Leukocytosis - she is on Vanc, Cefepime and Flagyl  - strep  viridans is likely contamiant- repeated cultures are negative  AKI - baseline unknown-  Cr has worsened steadily from 2.0 to 2.48- d/c'd Lasix yesterday - ? Prerenal due to third spacing- renal function is worsening - she is on Mobic BID and PRN Advil at home which is on hold - her son does not want her to be on dialysis  Renal mass - renal ultrasound from 3/16 reveals> Diffusely abnormal appearance of the right kidney which is enlarged with diffusely heterogeneous echotexture. Cannot exclude infiltrating mass such as lymphoma or renal cell carcinoma - as she is not a good candidate for surgery and as her son does not want her to undergo surgery and as she has a worsening renal function, will not order further imaging   Anorexia/ anasarca/ hypoalbuminemia/ Lethargy - ? Due to renal mass/ cancer- as mentioned, son does not want surgery and she is not a candidate    Normocytic anemia - anemia of chronic disease    Unspecified atrial fibrillation - new diagnosis - start IV Metoprolol as she is not alert enough to take oral    Dementia  - she is at baseline- cont Aricept and Namenda  Plan: Palliative care consulted as she is too lethargic to eat, move and barely able to  communicte       Time spent in minutes: 35 min DVT prophylaxis: SCDs Code Status: DNR- discussed with son Family Communication: son Disposition Plan: await palliative care eval Consultants:   none Procedures:   none Antimicrobials:  Anti-infectives (From  admission, onward)   Start     Dose/Rate Route Frequency Ordered Stop   04/17/18 0800  metroNIDAZOLE (FLAGYL) IVPB 500 mg     500 mg 100 mL/hr over 60 Minutes Intravenous Every 8 hours 04/17/18 0322     04/16/18 2200  vancomycin (VANCOCIN) IVPB 750 mg/150 ml premix  Status:  Discontinued     750 mg 150 mL/hr over 60 Minutes Intravenous Every 48 hours 04/16/18 1422 04/17/18 1137   04/14/18 2000  metroNIDAZOLE (FLAGYL) IVPB 500 mg  Status:  Discontinued       500 mg 100 mL/hr over 60 Minutes Intravenous Every 8 hours 04/14/18 1929 04/17/18 0322   04/14/18 1945  vancomycin (VANCOCIN) 1,250 mg in sodium chloride 0.9 % 250 mL IVPB     1,250 mg 166.7 mL/hr over 90 Minutes Intravenous  Once 04/14/18 1943 04/15/18 0700   04/14/18 1945  ceFEPIme (MAXIPIME) 1 g in sodium chloride 0.9 % 100 mL IVPB     1 g 200 mL/hr over 30 Minutes Intravenous Every 24 hours 04/14/18 1943     04/14/18 1942  vancomycin variable dose per unstable renal function (pharmacist dosing)  Status:  Discontinued      Does not apply See admin instructions 04/14/18 1943 04/15/18 1015   04/14/18 1930  ceFEPIme (MAXIPIME) 2 g in sodium chloride 0.9 % 100 mL IVPB  Status:  Discontinued     2 g 200 mL/hr over 30 Minutes Intravenous  Once 04/14/18 1929 04/14/18 1946   04/14/18 1930  vancomycin (VANCOCIN) IVPB 1000 mg/200 mL premix  Status:  Discontinued     1,000 mg 200 mL/hr over 60 Minutes Intravenous  Once 04/14/18 1929 04/14/18 1945   04/14/18 1915  cefTRIAXone (ROCEPHIN) 1 g in sodium chloride 0.9 % 100 mL IVPB  Status:  Discontinued     1 g 200 mL/hr over 30 Minutes Intravenous  Once 04/14/18 1903 04/14/18 1925   04/14/18 1915  azithromycin (ZITHROMAX) 500 mg in sodium chloride 0.9 % 250 mL IVPB  Status:  Discontinued     500 mg 250 mL/hr over 60 Minutes Intravenous  Once 04/14/18 1903 04/14/18 1925       Objective: Vitals:   04/17/18 0600 04/17/18 0610 04/17/18 0743 04/17/18 1142  BP:   109/66 115/81  Pulse: (!) 143  100 92  Resp: 20  16 20   Temp:   98 F (36.7 C) (!) 97.5 F (36.4 C)  TempSrc:   Axillary Oral  SpO2: 98%  99% 99%  Weight:  65.4 kg    Height:        Intake/Output Summary (Last 24 hours) at 04/17/2018 1640 Last data filed at 04/17/2018 0600 Gross per 24 hour  Intake 570 ml  Output 350 ml  Net 220 ml   Filed Weights   04/15/18 0500 04/16/18 0500 04/17/18 0610  Weight: 62.8 kg 62.8 kg 65.4 kg    Examination: General exam: Appears  comfortable  HEENT: PERRLA, oral mucosa moist, no sclera icterus or thrush Respiratory system: Clear to auscultation. Respiratory effort normal. Cardiovascular system: S1 & S2 heard, IIRR.  HR in 120s Gastrointestinal system: Abdomen soft, non-tender, nondistended. Normal bowel sounds. Central nervous system: asleep, awakens only for a few seconds   Extremities: No cyanosis, clubbing - 3 + pedal edema Skin: No rashes or ulcers      Data Reviewed: I have personally reviewed following labs and imaging studies  CBC: Recent Labs  Lab 04/14/18 1612 04/15/18 0133 04/16/18 0224 04/17/18 0248  WBC 23.6* 21.7* 22.5* 19.8*  NEUTROABS 23.1* 20.1* 20.9* 17.9*  HGB 8.8* 9.6* 9.2* 8.2*  HCT 28.6* 31.8* 29.1* 25.6*  MCV 81.7 79.9* 76.4* 77.8*  PLT 430* 483* 400 333*   Basic Metabolic Panel: Recent Labs  Lab 04/14/18 1612 04/15/18 0133 04/16/18 0224 04/17/18 0248  NA 136 135 134* 135  K 5.1 5.1 4.8 4.7  CL 107 105 104 104  CO2 18* 17* 19* 20*  GLUCOSE 89 103* 95 90  BUN 38* 43* 53* 65*  CREATININE 2.00* 1.97* 2.27* 2.48*  CALCIUM 9.3 9.4 9.0 8.8*  MG  --   --  1.9 2.0  PHOS  --   --  4.9* 4.6   GFR: Estimated Creatinine Clearance: 15.5 mL/min (A) (by C-G formula based on SCr of 2.48 mg/dL (H)). Liver Function Tests: Recent Labs  Lab 04/14/18 1612 04/15/18 0133 04/16/18 0224 04/17/18 0248  AST 29 28 24 23   ALT 9 10 9 9   ALKPHOS 76 85 72 98  BILITOT 0.4 0.8 0.7 0.5  PROT 5.4* 5.8* 5.2* 4.7*  ALBUMIN 1.6* 1.7* 1.5* 1.3*   No results for input(s): LIPASE, AMYLASE in the last 168 hours. No results for input(s): AMMONIA in the last 168 hours. Coagulation Profile: Recent Labs  Lab 04/14/18 1952  INR 1.2   Cardiac Enzymes: No results for input(s): CKTOTAL, CKMB, CKMBINDEX, TROPONINI in the last 168 hours. BNP (last 3 results) No results for input(s): PROBNP in the last 8760 hours. HbA1C: No results for input(s): HGBA1C in the last 72 hours. CBG: No results for  input(s): GLUCAP in the last 168 hours. Lipid Profile: No results for input(s): CHOL, HDL, LDLCALC, TRIG, CHOLHDL, LDLDIRECT in the last 72 hours. Thyroid Function Tests: Recent Labs    04/15/18 0133  TSH 1.816   Anemia Panel: Recent Labs    04/15/18 0133  VITAMINB12 4,847*  FOLATE 5.1*  FERRITIN 1,019*  TIBC 125*  IRON 7*  RETICCTPCT 1.7   Urine analysis:    Component Value Date/Time   COLORURINE AMBER (A) 04/14/2018 1748   APPEARANCEUR HAZY (A) 04/14/2018 1748   LABSPEC 1.024 04/14/2018 1748   PHURINE 5.0 04/14/2018 1748   GLUCOSEU NEGATIVE 04/14/2018 1748   HGBUR SMALL (A) 04/14/2018 1748   BILIRUBINUR NEGATIVE 04/14/2018 1748   [place] 04/14/2018 1748   PROTEINUR 100 (A) 04/14/2018 1748   NITRITE NEGATIVE 04/14/2018 1748   LEUKOCYTESUR SMALL (A) 04/14/2018 1748   Sepsis Labs: @LABRCNTIP (procalcitonin:4,lacticidven:4) ) Recent Results (from the past 240 hour(s))  Urine culture     Status: None   Collection Time: 04/14/18  5:48 PM  Result Value Ref Range Status   Specimen Description URINE, CATHETERIZED  Final   Special Requests NONE  Final   Culture   Final    NO GROWTH Performed at [facility], [place] [address], [place], [place] [address]    Report Status 04/15/2018 FINAL  Final  Blood culture (routine x 2)     Status: None (Preliminary result)   Collection Time: 04/14/18  7:33 PM  Result Value Ref Range Status   Specimen Description BLOOD RIGHT ANTECUBITAL  Final   Special Requests   Final    BOTTLES DRAWN AEROBIC AND ANAEROBIC Blood Culture adequate volume   Culture   Final    NO GROWTH 3 DAYS Performed at [facility], [place] [address], [place], [place] [address]    Report Status PENDING  Incomplete  Blood culture (routine x 2)  Status: Abnormal   Collection Time: 04/14/18  7:42 PM  Result Value Ref Range Status   Specimen Description BLOOD LEFT ANTECUBITAL  Final   Special Requests   Final    BOTTLES DRAWN AEROBIC AND  ANAEROBIC Blood Culture adequate volume   Culture  Setup Time   Final    GRAM POSITIVE COCCI ANAEROBIC BOTTLE ONLY CRITICAL RESULT CALLED TO, READ BACK BY AND VERIFIED WITH: G.ABBOTT,PHARMD [id number] 04/26/2018 M.CAMPBELL    Culture (A)  Final    VIRIDANS STREPTOCOCCUS THE SIGNIFICANCE OF ISOLATING THIS ORGANISM FROM A SINGLE SET OF BLOOD CULTURES WHEN MULTIPLE SETS ARE DRAWN IS UNCERTAIN. PLEASE NOTIFY THE MICROBIOLOGY DEPARTMENT WITHIN ONE WEEK IF SPECIATION AND SENSITIVITIES ARE REQUIRED. Performed at [facility], [place] [address] [place], [place] [address]    Report Status 04/17/2018 FINAL  Final  Blood Culture ID Panel (Reflexed)     Status: Abnormal   Collection Time: 04/14/18  7:42 PM  Result Value Ref Range Status   Enterococcus species NOT DETECTED NOT DETECTED Final   Listeria monocytogenes NOT DETECTED NOT DETECTED Final   Staphylococcus species NOT DETECTED NOT DETECTED Final   Staphylococcus aureus (BCID) NOT DETECTED NOT DETECTED Final   Streptococcus species DETECTED (A) NOT DETECTED Final    Comment: Not Enterococcus species, Streptococcus agalactiae, Streptococcus pyogenes, or Streptococcus pneumoniae. CRITICAL RESULT CALLED TO, READ BACK BY AND VERIFIED WITH: G.ABBOTT,PHARMD [id number] 04/26/2018 M.CAMPBELL    Streptococcus agalactiae NOT DETECTED NOT DETECTED Final   Streptococcus pneumoniae NOT DETECTED NOT DETECTED Final   Streptococcus pyogenes NOT DETECTED NOT DETECTED Final   Acinetobacter baumannii NOT DETECTED NOT DETECTED Final   Enterobacteriaceae species NOT DETECTED NOT DETECTED Final   Enterobacter cloacae complex NOT DETECTED NOT DETECTED Final   Escherichia coli NOT DETECTED NOT DETECTED Final   Klebsiella oxytoca NOT DETECTED NOT DETECTED Final   Klebsiella pneumoniae NOT DETECTED NOT DETECTED Final   Proteus species NOT DETECTED NOT DETECTED Final   Serratia marcescens NOT DETECTED NOT DETECTED Final   Haemophilus influenzae NOT DETECTED NOT DETECTED  Final   Neisseria meningitidis NOT DETECTED NOT DETECTED Final   Pseudomonas aeruginosa NOT DETECTED NOT DETECTED Final   Candida albicans NOT DETECTED NOT DETECTED Final   Candida glabrata NOT DETECTED NOT DETECTED Final   Candida krusei NOT DETECTED NOT DETECTED Final   Candida parapsilosis NOT DETECTED NOT DETECTED Final   Candida tropicalis NOT DETECTED NOT DETECTED Final    Comment: Performed at [facility], [place] [address], [place], [place] [address]  MRSA PCR Screening     Status: None   Collection Time: 04/14/18  9:47 PM  Result Value Ref Range Status   MRSA by PCR NEGATIVE NEGATIVE Final    Comment:        The GeneXpert MRSA Assay (FDA approved for NASAL specimens only), is one component of a comprehensive MRSA colonization surveillance program. It is not intended to diagnose MRSA infection nor to guide or monitor treatment for MRSA infections. Performed at [facility], [place] [address], [place], [place] [address]   Respiratory Panel by PCR     Status: None   Collection Time: 04/14/18 10:39 PM  Result Value Ref Range Status   Adenovirus NOT DETECTED NOT DETECTED Final   Coronavirus 229E NOT DETECTED NOT DETECTED Final    Comment: (NOTE) The Coronavirus on the Respiratory Panel, DOES NOT test for the novel  Coronavirus (2019 nCoV)    Coronavirus HKU1 NOT DETECTED NOT  DETECTED Final   Coronavirus NL63 NOT DETECTED NOT DETECTED Final   Coronavirus OC43 NOT DETECTED NOT DETECTED Final   Metapneumovirus NOT DETECTED NOT DETECTED Final   Rhinovirus / Enterovirus NOT DETECTED NOT DETECTED Final   Influenza A NOT DETECTED NOT DETECTED Final   Influenza B NOT DETECTED NOT DETECTED Final   Parainfluenza Virus 1 NOT DETECTED NOT DETECTED Final   Parainfluenza Virus 2 NOT DETECTED NOT DETECTED Final   Parainfluenza Virus 3 NOT DETECTED NOT DETECTED Final   Parainfluenza Virus 4 NOT DETECTED NOT DETECTED Final   Respiratory Syncytial Virus NOT DETECTED NOT DETECTED  Final   Bordetella pertussis NOT DETECTED NOT DETECTED Final   Chlamydophila pneumoniae NOT DETECTED NOT DETECTED Final   Mycoplasma pneumoniae NOT DETECTED NOT DETECTED Final    Comment: Performed at [facility], [place] [address], [place], [place] [address]  Culture, blood (routine x 2)     Status: None (Preliminary result)   Collection Time: 04/16/18  8:30 AM  Result Value Ref Range Status   Specimen Description BLOOD RIGHT HAND  Final   Special Requests   Final    BOTTLES DRAWN AEROBIC ONLY Blood Culture adequate volume   Culture   Final    NO GROWTH 1 DAY Performed at [facility], [place] [address] [place], [place] [address]    Report Status PENDING  Incomplete  Culture, blood (routine x 2)     Status: None (Preliminary result)   Collection Time: 04/16/18  8:35 AM  Result Value Ref Range Status   Specimen Description BLOOD LEFT HAND  Final   Special Requests   Final    BOTTLES DRAWN AEROBIC AND ANAEROBIC Blood Culture adequate volume   Culture   Final    NO GROWTH 1 DAY Performed at [facility], [place] [address], [place], [place] [address]    Report Status PENDING  Incomplete         Radiology Studies: Dg Chest Port 1 View  Result Date: 04/17/2018 CLINICAL DATA:  Respiratory failure and CHF. EXAM: PORTABLE CHEST 1 VIEW COMPARISON:  04/16/2018 FINDINGS: Stable mild cardiac enlargement. Stable consolidation of the left lower lobe with associated small left pleural effusion. Probable minimal right pleural effusion. Decrease in an probable CHF with mild residual interstitial edema suspected. Persistent left lower lung nodule measuring approximately 10-11 mm. IMPRESSION: 1. Stable left lower lobe consolidation and bilateral small pleural effusions, left greater than right. 2. Probable mild residual pulmonary interstitial edema. 3. Persistent nodule projecting over the left lower lung measuring approximately 10-11 mm. Electronically Signed   By: Aletta Edouard M.D.    On: 04/17/2018 08:07   Dg Chest Port 1 View  Result Date: 04/16/2018 CLINICAL DATA:  85 year old female with shortness of breath EXAM: PORTABLE CHEST 1 VIEW COMPARISON:  04/14/2018 FINDINGS: Cardiomediastinal silhouette unchanged. Increasing opacity in the left lung with obscuration of the left hemidiaphragm and the left heart border. Blunting of the right costophrenic angle and left costophrenic angle. No pneumothorax. Nodular densities at the lateral aspect of the left lung. IMPRESSION: Nodular densities at the lateral aspect of the left lung. Further evaluation with chest CT recommended to exclude malignancy. Increasing opacity on the left, may reflect developing infection versus layering pleural effusions. Bilateral, left greater than right pleural effusion. Electronically Signed   By: Corrie Mckusick D.O.   On: 04/16/2018 09:07      Scheduled Meds:  feeding supplement (ENSURE ENLIVE)  237 mL Oral BID  BM   mouth rinse  15 mL Mouth Rinse BID   metoprolol tartrate  5 mg Intravenous Q6H   sodium chloride flush  3 mL Intravenous Q12H   Continuous Infusions:  sodium chloride     ceFEPime (MAXIPIME) IV Stopped (04/16/18 2345)   metronidazole 500 mg (04/17/18 1009)     LOS: 2 days      Debbe Odea, MD [facility] Pager: [email or web address] Password [facility] 04/17/2018, 4:40 PM

## 2018-04-17 NOTE — Plan of Care (Signed)
Discussed with son in front of patient plan of care for the evening, pain management and need for rest tonight with some teach back displayed.

## 2018-04-17 NOTE — Consult Note (Signed)
Consultation Note Date: 04/17/2018   Patient Name: Nicole Lowery  DOB: 05-Dec-1926  MRN: [id number]  Age / Sex: 85 y.o., female  PCP: System, Pcp Not In Referring Physician: Debbe Odea, MD  Reason for Consultation: Establishing goals of care and Psychosocial/spiritual support    "I'm tired and I don't want to suffer."   HPI/Patient Profile: 85 y.o. female  with past medical history of dementia, atrial fibrillation, CHF who was admitted on 04/14/2018 with leg swelling and SOB.  She was found to have acute on chronic heart failure, afib with RVR, acute renal failure, hypoxia, and abnormal labs including a WBC of 23.6, hgb of 8.0 and an albumin of 1.6.    She had an admission in [place] in February 2020 during which she was treated for heart failure and atrial fibrillation.   Clinical Assessment and Goals of Care:  I have reviewed medical records including EPIC notes, labs and imaging, received report from the care team, assessed the patient and then met at the bedside along with her son Nicole Lowery  to discuss diagnosis prognosis, [place], EOL wishes, disposition and options.  I introduced Palliative Medicine as specialized medical care for people living with serious illness. It focuses on providing relief from the symptoms and stress of a serious illness. The goal is to improve quality of life for both the patient and the family. We discussed a brief life review of the patient.   She was living independently in [facility], [place].  Her dementia had advanced to the point where she was forgetting to eat.  Nicole Lowery had unplugged the stove and hired an Engineer, production to come in at least 5 days a week.  After her hospitalization in February, Nicole Lowery moved his mother to [place] to live with him and his wife  As far as functional and nutritional status since her hospitalization in February Gary has noticed a decline in his mother's  appetite.  She was walking at his house with a cane.  She was largely sedentary but could get up and walk to the bathroom on her own.  At this point his mother is eating very little and has not been out of bed since admission.  Nicole Lowery is very worried his mother will not survive the hospitalization.  We discussed her current illness and what it means in the larger context of her on-going co-morbidities.  Natural disease trajectory and expectations at EOL were discussed.  I attempted to elicit values and goals of care important to the patient.   The difference between aggressive medical intervention and comfort care was considered in light of the patient's goals of care.  Nicole Lowery feels strongly that if his mother could understand her complex situation and represent herself she would say "I'm tired and I don't want to suffer".  Advanced directives, concepts specific to code status, artifical feeding and hydration, and rehospitalization were considered and discussed.  [name] has named her son Nicole Lowery her HCPOA.  She does not want any life prolonging measures if she is near EOL.  Nicole Lowery spoke with Dr. Wynelle Cleveland this am and decided to choose DNR code status for his mother.  Hospice services outpatient and residential were explained.  Questions and concerns were addressed.  Hard Choices booklet left for review. The family was encouraged to call with questions or concerns.    Primary Decision Maker:  Nicole Lowery - Son    SUMMARY OF RECOMMENDATIONS     Please plan for right Kidney biopsy.  (Dr. Wynelle Cleveland please confirm this with Nicole Lowery on 3/19)  Otherwise the family would like to move forward with minimally invasive measures to treat what can be treated.  Nicole Lowery believes his mother would want conservative treatment and would want to prevent suffering.    No invasive procedures (TEE, hemodialysis).  No aggressive medical treatment (chemotherapy).  If the patient survives the hospitalization Nicole Lowery requests that she be  considered for placement at a facility.  His mother has a long term care insurance policy if she is unable to do rehab.  Nicole Lowery is hospice eligible.  When she has been medically optimized we will know if she is Hospice House eligible.   If she is placed in a long term facility she will immediately be able to receive hospice services.  Code Status/Advance Care Planning:  DNR   Symptom Management:   Per primary team.  Additional Recommendations (Limitations, Scope, Preferences):  Minimize Medications, No Artificial Feeding, No Chemotherapy, No Hemodialysis, No Radiation and No Surgical Procedures  Palliative Prophylaxis:   Delirium Protocol  Psycho-social/Spiritual:   Desire for further Chaplaincy support: yes  Prognosis: likely weeks.  Will be better able to determine as her hospitalization progresses.    Discharge Planning: To Be Determined      Primary Diagnoses: Present on Admission: . Acute respiratory failure with hypoxia ([place]) . Normocytic anemia . Unspecified atrial fibrillation ([place]) . SIRS (systemic inflammatory response syndrome) ([facility]) . Dementia ([place]) . Hypervolemia . Pleural effusion . Protein calorie malnutrition ([place])   I have reviewed the medical record, interviewed the patient and family, and examined the patient. The following aspects are pertinent.  Past Medical History:  Diagnosis Date  . Cataracts, bilateral   . GERD (gastroesophageal reflux disease)    Social History   Socioeconomic History  . Marital status: Single    Spouse name: Not on file  . Number of children: Not on file  . Years of education: Not on file  . Highest education level: Not on file  Occupational History  . Not on file  Social Needs  . Financial resource strain: Not on file  . Food insecurity:    Worry: Not on file    Inability: Not on file  . Transportation needs:    Medical: Not on file    Non-medical: Not on file  Tobacco Use  . Smoking status:  Never Smoker  . Smokeless tobacco: Never Used  Substance and Sexual Activity  . Alcohol use: Never    Frequency: Never  . Drug use: Never  . Sexual activity: Not on file  Lifestyle  . Physical activity:    Days per week: Not on file    Minutes per session: Not on file  . Stress: Not on file  Relationships  . Social connections:    Talks on phone: Not on file    Gets together: Not on file    Attends religious service: Not on file    Active member of club or organization: Not on file    Attends meetings of clubs or  organizations: Not on file    Relationship status: Not on file  Other Topics Concern  . Not on file  Social History Narrative  . Not on file   Family History  Problem Relation Age of Onset  . Hypertension Other    Scheduled Meds: . feeding supplement (ENSURE ENLIVE)  237 mL Oral BID BM  . mouth rinse  15 mL Mouth Rinse BID  . metoprolol tartrate  5 mg Intravenous Q6H  . sodium chloride flush  3 mL Intravenous Q12H   Continuous Infusions: . sodium chloride    . ceFEPime (MAXIPIME) IV Stopped (04/16/18 2345)  . metronidazole 500 mg (04/17/18 1826)   PRN Meds:.sodium chloride, acetaminophen, alum & mag hydroxide-simeth, ondansetron (ZOFRAN) IV, sodium chloride flush No Known Allergies Review of Systems patient is pleasantly demented.  Denies pain  Physical Exam  Elderly pleasantly demented female, lying comfortably in bed  Vital Signs: BP 113/65 (BP Location: Left Arm)   Pulse 65   Temp 97.7 F (36.5 C) (Oral)   Resp (!) 24   Ht '5\' 8"'  (1.727 m)   Wt 65.4 kg   SpO2 98%   BMI 21.92 kg/m  Pain Scale: 0-10 POSS *See Group Information*: 1-Acceptable,Awake and alert Pain Score: 0-No pain   SpO2: SpO2: 98 % O2 Device:SpO2: 98 % O2 Flow Rate: .O2 Flow Rate (L/min): 3 L/min  IO: Intake/output summary:   Intake/Output Summary (Last 24 hours) at 04/17/2018 2100 Last data filed at 04/17/2018 1923 Gross per 24 hour  Intake 830 ml  Output 600 ml  Net 230  ml    LBM: Last BM Date: 04/13/18 Baseline Weight: Weight: 59 kg Most recent weight: Weight: 65.4 kg     Palliative Assessment/Data: 20%   Flowsheet Rows     Most Recent Value  Intake Tab  Referral Department  Hospitalist  Unit at Time of Referral  Intermediate Care Unit  Palliative Care Primary Diagnosis  Cardiac  Date Notified  04/16/18  Palliative Care Type  New Palliative care  Reason for referral  Clarify Goals of Care  Date of Admission  04/14/18  # of days IP prior to Palliative referral  2  Clinical Assessment  Psychosocial & Spiritual Assessment  Palliative Care Outcomes      Time In: 5:00  Time Out: 6:10 Time Total: 70 mn Greater than 50%  of this time was spent counseling and coordinating care related to the above assessment and plan.  Signed by: Florentina Jenny, PA-C Palliative Medicine Pager: [phone]  Please contact Palliative Medicine Team phone at [phone] for questions and concerns.  For individual provider: See Shea Evans

## 2018-04-18 ENCOUNTER — Inpatient Hospital Stay (HOSPITAL_COMMUNITY): Payer: Medicare Other

## 2018-04-18 DIAGNOSIS — I509 Heart failure, unspecified: Secondary | ICD-10-CM

## 2018-04-18 LAB — CBC
HCT: 25.5 % — ABNORMAL LOW (ref 36.0–46.0)
Hemoglobin: 8.2 g/dL — ABNORMAL LOW (ref 12.0–15.0)
MCH: 24.8 pg — ABNORMAL LOW (ref 26.0–34.0)
MCHC: 32.2 g/dL (ref 30.0–36.0)
MCV: 77.3 fL — ABNORMAL LOW (ref 80.0–100.0)
Platelets: 369 10*3/uL (ref 150–400)
RBC: 3.3 MIL/uL — ABNORMAL LOW (ref 3.87–5.11)
RDW: 18.8 % — ABNORMAL HIGH (ref 11.5–15.5)
WBC: 18.6 10*3/uL — ABNORMAL HIGH (ref 4.0–10.5)
nRBC: 0 % (ref 0.0–0.2)

## 2018-04-18 LAB — PROCALCITONIN: Procalcitonin: 11.44 ng/mL

## 2018-04-18 LAB — PROTEIN / CREATININE RATIO, URINE
CREATININE, URINE: 116.72 mg/dL
Protein Creatinine Ratio: 0.93 mg/mg{Cre} — ABNORMAL HIGH (ref 0.00–0.15)
Total Protein, Urine: 109 mg/dL

## 2018-04-18 LAB — BASIC METABOLIC PANEL
Anion gap: 10 (ref 5–15)
BUN: 69 mg/dL — ABNORMAL HIGH (ref 8–23)
CO2: 17 mmol/L — ABNORMAL LOW (ref 22–32)
Calcium: 9.6 mg/dL (ref 8.9–10.3)
Chloride: 104 mmol/L (ref 98–111)
Creatinine, Ser: 2.44 mg/dL — ABNORMAL HIGH (ref 0.44–1.00)
GFR calc Af Amer: 20 mL/min — ABNORMAL LOW (ref >60)
GFR calc non Af Amer: 17 mL/min — ABNORMAL LOW (ref >60)
Glucose, Bld: 94 mg/dL (ref 70–99)
Potassium: 4.6 mmol/L (ref 3.5–5.1)
Sodium: 131 mmol/L — ABNORMAL LOW (ref 135–145)

## 2018-04-18 LAB — ECHOCARDIOGRAM COMPLETE
Height: 68 in
Weight: 2409.19 oz

## 2018-04-18 MED ORDER — METOPROLOL TARTRATE 50 MG PO TABS
50.0000 mg | ORAL_TABLET | Freq: Two times a day (BID) | ORAL | Status: DC
  Administered 2018-04-18: 50 mg via ORAL
  Filled 2018-04-18: qty 1

## 2018-04-18 MED ORDER — BISACODYL 10 MG RE SUPP
10.0000 mg | Freq: Every day | RECTAL | Status: DC | PRN
  Administered 2018-04-18: 10 mg via RECTAL
  Filled 2018-04-18: qty 1

## 2018-04-18 MED ORDER — FOLIC ACID 5 MG/ML IJ SOLN
1.0000 mg | Freq: Every day | INTRAMUSCULAR | Status: DC
  Administered 2018-04-18: 1 mg via INTRAVENOUS
  Filled 2018-04-18 (×2): qty 0.2

## 2018-04-18 MED ORDER — METOPROLOL TARTRATE 5 MG/5ML IV SOLN: 5.0000 mg | Freq: Four times a day (QID) | INTRAVENOUS | Status: DC | PRN

## 2018-04-18 MED ORDER — FUROSEMIDE 10 MG/ML IJ SOLN
40.0000 mg | Freq: Once | INTRAMUSCULAR | Status: AC
  Administered 2018-04-18: 40 mg via INTRAVENOUS
  Filled 2018-04-18: qty 4

## 2018-04-18 MED ORDER — PRO-STAT SUGAR FREE PO LIQD
30.0000 mL | Freq: Two times a day (BID) | ORAL | Status: DC
  Administered 2018-04-18 – 2018-04-20 (×3): 30 mL via ORAL
  Filled 2018-04-18 (×3): qty 30

## 2018-04-18 MED ORDER — BISACODYL 10 MG RE SUPP: 10.0000 mg | Freq: Every day | RECTAL | Status: DC

## 2018-04-18 NOTE — Progress Notes (Signed)
  Echocardiogram 2D Echocardiogram has been performed.  [name] 04/18/2018, 9:59 AM

## 2018-04-18 NOTE — Progress Notes (Addendum)
PROGRESS NOTE    Nicole Lowery   IHK:[id number]  DOB: Sep 05, 1926  DOA: 04/14/2018 PCP: Default, Provider, MD   Brief Narrative:  Nicole Lowery is a 85 y.o.femalewith medical history significant fordementia, admitted to the hospital in February with atrial fibrillation and acute CHF, now presenting to the ED for evaluation of progressive loss of appetite, generalized weakness and lower extremity edema. The patient was admitted in [facility] for pedal edema in February and was given diuretic. According to her son, she has been having edema of her lower extremity beginning in February and she did improve after discharge after diuretics were given however her legs have swollen up again worse than before and she is no longer able to put her shoes on. Patient had been losing weight.   For the past 2 weeks her oral intake has declined suddenly again.   In the ED, temp noted to be temp was 99.2, pulse ox 80% on room air, WBC count 23.6, albumin 1.6, BUN 38 and creatinine 2.0.  Lower extremities were quite edematous. Chest x-ray revealed bilateral pleural effusions and compressive atelectasis.  She was also noted to have atrial fibrillation which is a new diagnosis for her.  Subjective:  She has no complaints when I saw this AM. RN was cleaning her from a BM. Marland Kitchen     Assessment & Plan:   Principal Problem:   Acute respiratory failure with hypoxia - LLL pneumonia and pulmonary edema - also has pleural effusions and compressive atelectasis - resp virus panel is negative, MRSA PCR neg-   - has been given Lasix and some improvement in effusions - she is no longer requiring O2 - 2 D ECHO pending  Active Problems:  Leukocytosis- LLL pneumonia Strep viridans in 1/ 4 bottles of blood cultures - started on Vanc, Cefepime and Flagyl  - Procalcitonin 4.39 > 17.36> 15.51> 11.44 - lactic acid was 2.3>> 3.1>> 2.5 - strep viridans is likely contamiant- repeated cultures are  negative - Vanc d/c'd on 3/18  - cont Cefepime and Flagyl for possible pneumonia - WBC count was 23.6 (mostly neutrophils) and has improved to 18.6 today- would like to complete a 7 day course  AKI- CKD 3- GFR was 30 in February (labs received from office)  -  Cr has worsened steadily from 2.0 on admission to 2.48- GFR now 17 -   Lasix and Vanc d/c'd and Cr has stabilized  - she is on Mobic BID and PRN Advil at home which is on hold - she has protein in her urine but does not have nephrotic range proteinuria - her son does not want her to be on dialysis and thus I have not contacted nephrology  Possible right sided Renal mass - renal ultrasound from 3/16 reveals> Diffusely abnormal appearance of the right kidney which is enlarged with diffusely heterogeneous echotexture. Cannot exclude infiltrating mass such as lymphoma or renal cell carcinoma - as she is not a good candidate for surgery - her son does not want her to undergo surgery either - I have spoken with the radiologist today who states a CT without contrast may be helpful to further assess the mass - I will order it today  Anorexia/ anasarca/ hypoalbuminemia/ Lethargy -  Per son, has been losing weight, has had a poor appetite and has been more fatigued - mostly sleeping in the hospital and not eating much - ? If due to renal mass/ cancer- as mentioned, son does not want surgery and she  is not a candidate - Alb 1.3,  normal TSH - Ensure ordered  Pedal edema - TEDS    Normocytic anemia - anemia of chronic disease- mild folic acid deficiency- will replace    Ref. Range 04/15/2018 01:33  Iron Latest Ref Range: 28 - 170 ug/dL 7 (L)  UIBC Latest Units: ug/dL 118  TIBC Latest Ref Range: 250 - 450 ug/dL 125 (L)  Saturation Ratios Latest Ref Range: 10.4 - 31.8 % 6 (L)  Ferritin Latest Ref Range: 11 - 307 ng/mL 1,019 (H)  Folate Latest Ref Range: >5.9 ng/mL 5.1 (L)  Vitamin B12 Latest Ref Range: 180 - 914 pg/mL 4,847 (H)       Unspecified atrial fibrillation - new diagnosis - started IV Metoprolol - rate is controlled today    Dementia  - she is at baseline- cont Aricept and Namenda  Plan: based on how well she eats, participates in PT, improves clinically, will go to either a rehab facility with palliative care or a hospice home from the hospital  Time spent in minutes: 45 min- time spent in extensive conversations with son, obtaining notes and labs from PCP, speaking with radiology and nephrology DVT prophylaxis: SCDs Code Status: DNR- discussed with son Family Communication: son Disposition Plan: monitor for clinical improvement Consultants:   none Procedures:   none Antimicrobials:  Anti-infectives (From admission, onward)   Start     Dose/Rate Route Frequency Ordered Stop   04/17/18 0800  metroNIDAZOLE (FLAGYL) IVPB 500 mg     500 mg 100 mL/hr over 60 Minutes Intravenous Every 8 hours 04/17/18 0322     04/16/18 2200  vancomycin (VANCOCIN) IVPB 750 mg/150 ml premix  Status:  Discontinued     750 mg 150 mL/hr over 60 Minutes Intravenous Every 48 hours 04/16/18 1422 04/17/18 1137   04/14/18 2000  metroNIDAZOLE (FLAGYL) IVPB 500 mg  Status:  Discontinued     500 mg 100 mL/hr over 60 Minutes Intravenous Every 8 hours 04/14/18 1929 04/17/18 0322   04/14/18 1945  vancomycin (VANCOCIN) 1,250 mg in sodium chloride 0.9 % 250 mL IVPB     1,250 mg 166.7 mL/hr over 90 Minutes Intravenous  Once 04/14/18 1943 04/15/18 0700   04/14/18 1945  ceFEPIme (MAXIPIME) 1 g in sodium chloride 0.9 % 100 mL IVPB     1 g 200 mL/hr over 30 Minutes Intravenous Every 24 hours 04/14/18 1943     04/14/18 1942  vancomycin variable dose per unstable renal function (pharmacist dosing)  Status:  Discontinued      Does not apply See admin instructions 04/14/18 1943 04/15/18 1015   04/14/18 1930  ceFEPIme (MAXIPIME) 2 g in sodium chloride 0.9 % 100 mL IVPB  Status:  Discontinued     2 g 200 mL/hr over 30 Minutes Intravenous  Once  04/14/18 1929 04/14/18 1946   04/14/18 1930  vancomycin (VANCOCIN) IVPB 1000 mg/200 mL premix  Status:  Discontinued     1,000 mg 200 mL/hr over 60 Minutes Intravenous  Once 04/14/18 1929 04/14/18 1945   04/14/18 1915  cefTRIAXone (ROCEPHIN) 1 g in sodium chloride 0.9 % 100 mL IVPB  Status:  Discontinued     1 g 200 mL/hr over 30 Minutes Intravenous  Once 04/14/18 1903 04/14/18 1925   04/14/18 1915  azithromycin (ZITHROMAX) 500 mg in sodium chloride 0.9 % 250 mL IVPB  Status:  Discontinued     500 mg 250 mL/hr over 60 Minutes Intravenous  Once 04/14/18 1903 04/14/18 1925  Objective: Vitals:   04/18/18 0600 04/18/18 0631 04/18/18 0738 04/18/18 1135  BP:  (!) 101/59 (!) 93/58 (P) 100/61  Pulse: (!) 124 63 (!) 57 (!) (P) 104  Resp: (!) 24 19 16  (P) 16  Temp:   (!) 97.5 F (36.4 C)   TempSrc:   Oral   SpO2: 98% 99% 99% (P) 95%  Weight:      Height:        Intake/Output Summary (Last 24 hours) at 04/18/2018 1242 Last data filed at 04/18/2018 0600 Gross per 24 hour  Intake 770 ml  Output 250 ml  Net 520 ml   Filed Weights   04/16/18 0500 04/17/18 0610 04/18/18 0407  Weight: 62.8 kg 65.4 kg 68.3 kg    Examination: General exam: Appears comfortable  HEENT: PERRLA, oral mucosa moist, no sclera icterus or thrush Respiratory system: Clear to auscultation. Respiratory effort normal. Cardiovascular system: S1 & S2 heard, IIRR, No murmurs  Gastrointestinal system: Abdomen soft, non-tender, nondistended. Normal bowel sounds   Central nervous system: Alert and oriented. No focal neurological deficits. Extremities: No cyanosis, clubbing - 3 + edema Skin: No rashes or ulcers Psychiatry:  Mood & affect appropriate.    Data Reviewed: I have personally reviewed following labs and imaging studies  CBC: Recent Labs  Lab 04/14/18 1612 04/15/18 0133 04/16/18 0224 04/17/18 0248 04/18/18 0319  WBC 23.6* 21.7* 22.5* 19.8* 18.6*  NEUTROABS 23.1* 20.1* 20.9* 17.9*  --   HGB 8.8*  9.6* 9.2* 8.2* 8.2*  HCT 28.6* 31.8* 29.1* 25.6* 25.5*  MCV 81.7 79.9* 76.4* 77.8* 77.3*  PLT 430* 483* 400 410* 160   Basic Metabolic Panel: Recent Labs  Lab 04/14/18 1612 04/15/18 0133 04/16/18 0224 04/17/18 0248 04/18/18 0319  NA 136 135 134* 135 131*  K 5.1 5.1 4.8 4.7 4.6  CL 107 105 104 104 104  CO2 18* 17* 19* 20* 17*  GLUCOSE 89 103* 95 90 94  BUN 38* 43* 53* 65* 69*  CREATININE 2.00* 1.97* 2.27* 2.48* 2.44*  CALCIUM 9.3 9.4 9.0 8.8* 9.6  MG  --   --  1.9 2.0  --   PHOS  --   --  4.9* 4.6  --    GFR: Estimated Creatinine Clearance: 15.8 mL/min (A) (by C-G formula based on SCr of 2.44 mg/dL (H)). Liver Function Tests: Recent Labs  Lab 04/14/18 1612 04/15/18 0133 04/16/18 0224 04/17/18 0248  AST 29 28 24 23   ALT 9 10 9 9   ALKPHOS 76 85 72 98  BILITOT 0.4 0.8 0.7 0.5  PROT 5.4* 5.8* 5.2* 4.7*  ALBUMIN 1.6* 1.7* 1.5* 1.3*   No results for input(s): LIPASE, AMYLASE in the last 168 hours. No results for input(s): AMMONIA in the last 168 hours. Coagulation Profile: Recent Labs  Lab 04/14/18 1952  INR 1.2   Cardiac Enzymes: No results for input(s): CKTOTAL, CKMB, CKMBINDEX, TROPONINI in the last 168 hours. BNP (last 3 results) No results for input(s): PROBNP in the last 8760 hours. HbA1C: No results for input(s): HGBA1C in the last 72 hours. CBG: No results for input(s): GLUCAP in the last 168 hours. Lipid Profile: No results for input(s): CHOL, HDL, LDLCALC, TRIG, CHOLHDL, LDLDIRECT in the last 72 hours. Thyroid Function Tests: No results for input(s): TSH, T4TOTAL, FREET4, T3FREE, THYROIDAB in the last 72 hours. Anemia Panel: No results for input(s): VITAMINB12, FOLATE, FERRITIN, TIBC, IRON, RETICCTPCT in the last 72 hours. Urine analysis:    Component Value Date/Time   COLORURINE AMBER (  A) 04/14/2018 1748   APPEARANCEUR HAZY (A) 04/14/2018 1748   LABSPEC 1.024 04/14/2018 1748   PHURINE 5.0 04/14/2018 1748   GLUCOSEU NEGATIVE 04/14/2018 1748    HGBUR SMALL (A) 04/14/2018 1748   [place] 04/14/2018 [place] 04/14/2018 1748   PROTEINUR 100 (A) 04/14/2018 1748   NITRITE NEGATIVE 04/14/2018 1748   LEUKOCYTESUR SMALL (A) 04/14/2018 1748   Sepsis Labs: @LABRCNTIP (procalcitonin:4,lacticidven:4) ) Recent Results (from the past 240 hour(s))  Urine culture     Status: None   Collection Time: 04/14/18  5:48 PM  Result Value Ref Range Status   Specimen Description URINE, CATHETERIZED  Final   Special Requests NONE  Final   Culture   Final    NO GROWTH Performed at [facility], [place] [address] [place], [place] [address]    Report Status 04/15/2018 FINAL  Final  Blood culture (routine x 2)     Status: None (Preliminary result)   Collection Time: 04/14/18  7:33 PM  Result Value Ref Range Status   Specimen Description BLOOD RIGHT ANTECUBITAL  Final   Special Requests   Final    BOTTLES DRAWN AEROBIC AND ANAEROBIC Blood Culture adequate volume   Culture   Final    NO GROWTH 4 DAYS Performed at [facility], [place] [address], [place], [place] [address]    Report Status PENDING  Incomplete  Blood culture (routine x 2)     Status: Abnormal   Collection Time: 04/14/18  7:42 PM  Result Value Ref Range Status   Specimen Description BLOOD LEFT ANTECUBITAL  Final   Special Requests   Final    BOTTLES DRAWN AEROBIC AND ANAEROBIC Blood Culture adequate volume   Culture  Setup Time   Final    GRAM POSITIVE COCCI ANAEROBIC BOTTLE ONLY CRITICAL RESULT CALLED TO, READ BACK BY AND VERIFIED WITH: G.ABBOTT,PHARMD [id number] 04/26/2018 M.CAMPBELL    Culture (A)  Final    VIRIDANS STREPTOCOCCUS THE SIGNIFICANCE OF ISOLATING THIS ORGANISM FROM A SINGLE SET OF BLOOD CULTURES WHEN MULTIPLE SETS ARE DRAWN IS UNCERTAIN. PLEASE NOTIFY THE MICROBIOLOGY DEPARTMENT WITHIN ONE WEEK IF SPECIATION AND SENSITIVITIES ARE REQUIRED. Performed at [facility], [place] [address], [place], [place] [address]    Report Status  04/17/2018 FINAL  Final  Blood Culture ID Panel (Reflexed)     Status: Abnormal   Collection Time: 04/14/18  7:42 PM  Result Value Ref Range Status   Enterococcus species NOT DETECTED NOT DETECTED Final   Listeria monocytogenes NOT DETECTED NOT DETECTED Final   Staphylococcus species NOT DETECTED NOT DETECTED Final   Staphylococcus aureus (BCID) NOT DETECTED NOT DETECTED Final   Streptococcus species DETECTED (A) NOT DETECTED Final    Comment: Not Enterococcus species, Streptococcus agalactiae, Streptococcus pyogenes, or Streptococcus pneumoniae. CRITICAL RESULT CALLED TO, READ BACK BY AND VERIFIED WITH: G.ABBOTT,PHARMD [id number] 04/26/2018 M.CAMPBELL    Streptococcus agalactiae NOT DETECTED NOT DETECTED Final   Streptococcus pneumoniae NOT DETECTED NOT DETECTED Final   Streptococcus pyogenes NOT DETECTED NOT DETECTED Final   Acinetobacter baumannii NOT DETECTED NOT DETECTED Final   Enterobacteriaceae species NOT DETECTED NOT DETECTED Final   Enterobacter cloacae complex NOT DETECTED NOT DETECTED Final   Escherichia coli NOT DETECTED NOT DETECTED Final   Klebsiella oxytoca NOT DETECTED NOT DETECTED Final   Klebsiella pneumoniae NOT DETECTED NOT DETECTED Final   Proteus species NOT DETECTED NOT DETECTED Final   Serratia marcescens NOT DETECTED NOT DETECTED Final  Haemophilus influenzae NOT DETECTED NOT DETECTED Final   Neisseria meningitidis NOT DETECTED NOT DETECTED Final   Pseudomonas aeruginosa NOT DETECTED NOT DETECTED Final   Candida albicans NOT DETECTED NOT DETECTED Final   Candida glabrata NOT DETECTED NOT DETECTED Final   Candida krusei NOT DETECTED NOT DETECTED Final   Candida parapsilosis NOT DETECTED NOT DETECTED Final   Candida tropicalis NOT DETECTED NOT DETECTED Final    Comment: Performed at [facility], [place] [address] [place], [place] [address]  MRSA PCR Screening     Status: None   Collection Time: 04/14/18  9:47 PM  Result Value Ref Range Status   MRSA by PCR  NEGATIVE NEGATIVE Final    Comment:        The GeneXpert MRSA Assay (FDA approved for NASAL specimens only), is one component of a comprehensive MRSA colonization surveillance program. It is not intended to diagnose MRSA infection nor to guide or monitor treatment for MRSA infections. Performed at [facility], [place] [address], [place], [place] [address]   Respiratory Panel by PCR     Status: None   Collection Time: 04/14/18 10:39 PM  Result Value Ref Range Status   Adenovirus NOT DETECTED NOT DETECTED Final   Coronavirus 229E NOT DETECTED NOT DETECTED Final    Comment: (NOTE) The Coronavirus on the Respiratory Panel, DOES NOT test for the novel  Coronavirus (2019 nCoV)    Coronavirus HKU1 NOT DETECTED NOT DETECTED Final   Coronavirus NL63 NOT DETECTED NOT DETECTED Final   Coronavirus OC43 NOT DETECTED NOT DETECTED Final   Metapneumovirus NOT DETECTED NOT DETECTED Final   Rhinovirus / Enterovirus NOT DETECTED NOT DETECTED Final   Influenza A NOT DETECTED NOT DETECTED Final   Influenza B NOT DETECTED NOT DETECTED Final   Parainfluenza Virus 1 NOT DETECTED NOT DETECTED Final   Parainfluenza Virus 2 NOT DETECTED NOT DETECTED Final   Parainfluenza Virus 3 NOT DETECTED NOT DETECTED Final   Parainfluenza Virus 4 NOT DETECTED NOT DETECTED Final   Respiratory Syncytial Virus NOT DETECTED NOT DETECTED Final   Bordetella pertussis NOT DETECTED NOT DETECTED Final   Chlamydophila pneumoniae NOT DETECTED NOT DETECTED Final   Mycoplasma pneumoniae NOT DETECTED NOT DETECTED Final    Comment: Performed at [facility], [place]. [address], [place], [place] [address]  Culture, blood (routine x 2)     Status: None (Preliminary result)   Collection Time: 04/16/18  8:30 AM  Result Value Ref Range Status   Specimen Description BLOOD RIGHT HAND  Final   Special Requests   Final    BOTTLES DRAWN AEROBIC ONLY Blood Culture adequate volume   Culture   Final    NO GROWTH 2 DAYS Performed at  [facility], [place] [address] [place], [place] [address]    Report Status PENDING  Incomplete  Culture, blood (routine x 2)     Status: None (Preliminary result)   Collection Time: 04/16/18  8:35 AM  Result Value Ref Range Status   Specimen Description BLOOD LEFT HAND  Final   Special Requests   Final    BOTTLES DRAWN AEROBIC AND ANAEROBIC Blood Culture adequate volume   Culture   Final    NO GROWTH 2 DAYS Performed at [facility], [place] [address] [place],  [address]    Report Status PENDING  Incomplete         Radiology Studies: Dg Chest Port 1 View  Result Date:  04/17/2018 CLINICAL DATA:  Respiratory failure and CHF. EXAM: PORTABLE CHEST 1 VIEW COMPARISON:  04/16/2018 FINDINGS: Stable mild cardiac enlargement. Stable consolidation of the left lower lobe with associated small left pleural effusion. Probable minimal right pleural effusion. Decrease in an probable CHF with mild residual interstitial edema suspected. Persistent left lower lung nodule measuring approximately 10-11 mm. IMPRESSION: 1. Stable left lower lobe consolidation and bilateral small pleural effusions, left greater than right. 2. Probable mild residual pulmonary interstitial edema. 3. Persistent nodule projecting over the left lower lung measuring approximately 10-11 mm. Electronically Signed   By: Aletta Edouard M.D.   On: 04/17/2018 08:07      Scheduled Meds:  feeding supplement (ENSURE ENLIVE)  237 mL Oral BID BM   mouth rinse  15 mL Mouth Rinse BID   metoprolol tartrate  5 mg Intravenous Q6H   sodium chloride flush  3 mL Intravenous Q12H   Continuous Infusions:  sodium chloride     ceFEPime (MAXIPIME) IV 1 g (04/17/18 2249)   metronidazole 500 mg (04/18/18 0921)     LOS: 3 days      Debbe Odea, MD [facility] Pager: [email or web address] Password [facility] 04/18/2018, 12:42 PM

## 2018-04-18 NOTE — Progress Notes (Signed)
Pharmacy Antibiotic Note  Nicole Lowery is a 85 y.o. female on day # 2 antibiotics for sepsis and empiric coverage. Continues on Cefepime and Flagyl.    She has been on D4 of abx now. Cultures have remained neg except for the likely contaminant viridans strep. D/w Dr Wynelle Cleveland today and will treat for 7d total with cefepime/flagyl.    Plan: Continue current dose x7d Rx signs off  Height: 5\' 8"  (172.7 cm) Weight: 150 lb 9.2 oz (68.3 kg) IBW/kg (Calculated) : 63.9  Temp (24hrs), Avg:97.8 F (36.6 C), Min:97.5 F (36.4 C), Max:98 F (36.7 C)  Recent Labs  Lab 04/14/18 1612 04/14/18 1836 04/14/18 1957 04/15/18 0133 04/15/18 0528 04/16/18 0224 04/17/18 0248 04/18/18 0319  WBC 23.6*  --   --  21.7*  --  22.5* 19.8* 18.6*  CREATININE 2.00*  --   --  1.97*  --  2.27* 2.48* 2.44*  LATICACIDVEN  --  2.4* 2.3* 3.1* 2.5*  --   --   --     Estimated Creatinine Clearance: 15.8 mL/min (A) (by C-G formula based on SCr of 2.44 mg/dL (H)).    No Known Allergies  Antimicrobials this admission: flagyl 3/16 >>3/22 vancomycin 3/16 x 1; resume 3/17 >>3/17 cefepime 3/16 >>3/22  Dose adjustments this admission:  n/a  Microbiology results: 3/15 respiratory panel negative 3/15 MRSA PCR negative 3/15 urine - negative 3/15 blood x 2 - GPC in 1 of 2 bottles, viridans strep 3/17 blood>>neg  Onnie Boer, PharmD, [place], AAHIVP, CPP Infectious Disease Pharmacist 04/18/2018 12:48 PM

## 2018-04-18 NOTE — Evaluation (Signed)
Physical Therapy Evaluation Patient Details Name: Nicole Lowery MRN: [id number] DOB: [birth year] Today's Date: 04/18/2018   History of Present Illness  Pt is a 85 y.o. female with progressive dementia admitted 04/14/18 with progressive loss of appetite, generalized weakness and LE edema. Worked up for acute respiratory failure with LLL PNA and pulmonary edema, leukocytosis and AKI on CKD 3. Pt with possible R-side renal mass; plan for biopsy. PMH includes CHF, afib, cataracts.    Clinical Impression  Pt presents with an overall decrease in functional mobility secondary to above. Pt with baseline dementia and poor historian; per chart, resides with son. Pt currently requiring assist for mobility and ADLs; limited by generalized weakness, BLE swelling and impaired cognition. Pt would benefit from continued acute PT services to maximize functional mobility and independence prior to d/c with SNF-level therapies.     Follow Up Recommendations SNF;Supervision/Assistance - 24 hour    Equipment Recommendations  (TBD)    Recommendations for Other Services       Precautions / Restrictions Precautions Precautions: Fall Restrictions Weight Bearing Restrictions: No      Mobility  Bed Mobility               General bed mobility comments: Requiring assist for bed mobility and repositioning due to BLE weakness. Further mobility limited due to arrival of transport for CT  Transfers                 General transfer comment: RN reports assist+1 to stand and take steps from bed<>chair, bilateral knee instability  Ambulation/Gait                Stairs            Wheelchair Mobility    Modified Rankin (Stroke Patients Only)       Balance                                             Pertinent Vitals/Pain Pain Assessment: No/denies pain    Home Living Family/patient expects to be discharged to:: Private residence Living Arrangements:  Children               Additional Comments: Pt poor historian, reports she lives alone. Per chart, pt lives with son in [place] since ongoing cognitive decline    Prior Function Level of Independence: Needs assistance   Gait / Transfers Assistance Needed: Pt poor historian. No family present to provide details regarding PLOF           Hand Dominance        Extremity/Trunk Assessment   Upper Extremity Assessment Upper Extremity Assessment: Generalized weakness    Lower Extremity Assessment Lower Extremity Assessment: RLE deficits/detail;LLE deficits/detail RLE Deficits / Details: Bed-level RLE hip and knee flexion <3/5 against gravity, ankle PF/DF 3/5; lower leg pitting edema LLE Deficits / Details: Bed-level LLE hip and knee flexion <3/5 against gravity, ankle PF/DF 3/5; lower leg pitting edema       Communication   Communication: (soft-spoken)  Cognition Arousal/Alertness: Awake/alert Behavior During Therapy: Flat affect Overall Cognitive Status: History of cognitive impairments - at baseline                                 General Comments: Unreliable historian. Following one-step commands with increased time  General Comments      Exercises     Assessment/Plan    PT Assessment Patient needs continued PT services  PT Problem List Decreased strength;Decreased activity tolerance;Decreased balance;Decreased mobility;Decreased cognition;Decreased knowledge of use of DME       PT Treatment Interventions DME instruction;Gait training;Functional mobility training;Therapeutic activities;Therapeutic exercise;Balance training;Patient/family education    PT Goals (Current goals can be found in the Care Plan section)  Acute Rehab PT Goals Patient Stated Goal: None stated    Frequency Min 2X/week   Barriers to discharge        Co-evaluation               AM-PAC PT "6 Clicks" Mobility  Outcome Measure Help needed turning from  your back to your side while in a flat bed without using bedrails?: A Little Help needed moving from lying on your back to sitting on the side of a flat bed without using bedrails?: A Lot Help needed moving to and from a bed to a chair (including a wheelchair)?: A Lot Help needed standing up from a chair using your arms (e.g., wheelchair or bedside chair)?: A Lot Help needed to walk in hospital room?: A Lot Help needed climbing 3-5 steps with a railing? : Total 6 Click Score: 12    End of Session   Activity Tolerance: Patient limited by fatigue Patient left: in bed;with call bell/phone within reach;with nursing/sitter in room;Other (comment)(with CT for transport) Nurse Communication: Mobility status PT Visit Diagnosis: Other abnormalities of gait and mobility (R26.89);Muscle weakness (generalized) (M62.81)    Time: 4166-0630 PT Time Calculation (min) (ACUTE ONLY): 9 min   Charges:   PT Evaluation $PT Eval Moderate Complexity: [place], PT, DPT Acute Rehabilitation Services  Pager [phone] Office [place] 04/18/2018, 5:00 PM

## 2018-04-18 NOTE — Consult Note (Addendum)
Reason for Consult: AKI/CKD stage 3 Referring Physician: Wynelle Cleveland, MD  Nicole Lowery is an 85 y.o. female.  HPI: Nicole Lowery has a PMH significant for PAC's, dementia, CHF, CKD stage 3 (Cr was 1.5 on 03/07/18) who was recently admitted to an OSH in [place], [place] in February with PAC's and acute CHF after she presented with worsening shortness of breath.  Prior to that hospitalization she was living independently but moved to [place] after discharge to live with her son.  Over the past 2 weeks she has had worsening weakness, anorexia, and lower extremity edema.  She was brought to [facility] ED for further evaluation and was found to have bilateral pleural effusions and worsening renal function.  She was admitted for IV diuresis for volume overload and during this admission her BUN/cr have continued to climb.  The trend in Scr is seen below.  US revealed an abnormality of her right kidney concerning for an infiltrative process such as lymphoma or malignancy.  We were consulted to help further evaluate and manage her AKI/CKD stage 3.  Of note, she had been taking mobic and ibuprofen prior to admission.  Palliative care has already been consulted and I spoke with the son separately and they are not interested in pursuing dialysis nor biopsy of the mass as she is not a candidate for chemo.  Trend in Creatinine: Creatinine, Ser  Date/Time Value Ref Range Status  04/18/2018 03:19 AM 2.44 (H) 0.44 - 1.00 mg/dL Final  04/17/2018 02:48 AM 2.48 (H) 0.44 - 1.00 mg/dL Final  04/16/2018 02:24 AM 2.27 (H) 0.44 - 1.00 mg/dL Final  04/15/2018 01:33 AM 1.97 (H) 0.44 - 1.00 mg/dL Final  04/14/2018 04:12 PM 2.00 (H) 0.44 - 1.00 mg/dL Final    PMH:   Past Medical History:  Diagnosis Date  . Cataracts, bilateral   . GERD (gastroesophageal reflux disease)     PSH:  History reviewed. No pertinent surgical history.  Allergies: No Known Allergies  Medications:   Prior to Admission medications   Medication Sig  Start Date End Date Taking? Authorizing Provider  aspirin EC 81 MG tablet Take 81 mg by mouth daily.   Yes [provider]  Calcium Carb-Cholecalciferol (CALCIUM 500 +D PO) Take 1 tablet by mouth 2 (two) times daily.   Yes [provider]  donepezil (ARICEPT) 10 MG tablet Take 10 mg by mouth daily. 02/06/18  Yes [provider]  dorzolamide (TRUSOPT) 2 % ophthalmic solution Place 1 drop into the left eye 2 (two) times daily.   Yes [provider]  esomeprazole (NEXIUM) 20 MG capsule Take 20 mg by mouth daily.    Yes [provider]  ibuprofen (ADVIL,MOTRIN) 200 MG tablet Take 400 mg by mouth every 6 (six) hours as needed for headache (pain).   Yes [provider]  latanoprost (XALATAN) 0.005 % ophthalmic solution Place 1 drop into both eyes at bedtime. 03/27/18  Yes [provider]  meloxicam (MOBIC) 15 MG tablet Take 7.5 mg by mouth 2 (two) times daily. 03/19/18  Yes [provider]  memantine (NAMENDA XR) 28 MG CP24 24 hr capsule Take 28 mg by mouth daily. 03/19/18  Yes [provider]  metoprolol succinate (TOPROL-XL) 50 MG 24 hr tablet Take 50 mg by mouth daily. 03/09/18  Yes [provider]  vitamin B-12 (CYANOCOBALAMIN) 1000 MCG tablet Take 1,000 mcg by mouth daily.   Yes [provider]  Vitamin D, Cholecalciferol, 50 MCG (2000 UT) CAPS Take 2,000 Units  by mouth daily.   Yes [provider]    Inpatient medications: . feeding supplement (ENSURE ENLIVE)  237 mL Oral BID BM  . folic acid  1 mg Intravenous Daily  . mouth rinse  15 mL Mouth Rinse BID  . metoprolol tartrate  5 mg Intravenous Q6H  . sodium chloride flush  3 mL Intravenous Q12H    Discontinued Meds:   Medications Discontinued During This Encounter  Medication Reason  . cefTRIAXone (ROCEPHIN) 1 g in sodium chloride 0.9 % 100 mL IVPB   . azithromycin (ZITHROMAX) 500 mg in sodium chloride 0.9 % 250 mL IVPB   . vancomycin  (VANCOCIN) IVPB 1000 mg/200 mL premix Change in therapy  . ceFEPIme (MAXIPIME) 2 g in sodium chloride 0.9 % 100 mL IVPB Change in therapy  . vancomycin variable dose per unstable renal function (pharmacist dosing)   . furosemide (LASIX) injection 40 mg   . metroNIDAZOLE (FLAGYL) IVPB 500 mg   . vancomycin (VANCOCIN) IVPB 750 mg/150 ml premix     Social History:  reports that she has never smoked. She has never used smokeless tobacco. She reports that she does not drink alcohol or use drugs.  Family History:   Family History  Problem Relation Age of Onset  . Hypertension Other     Pertinent items are noted in HPI. Weight change: 2.9 kg  Intake/Output Summary (Last 24 hours) at 04/18/2018 1451 Last data filed at 04/18/2018 0600 Gross per 24 hour  Intake 650 ml  Output 250 ml  Net 400 ml   BP (!) 103/55 (BP Location: Left Arm)   Pulse 68   Temp (!) 97.5 F (36.4 C) (Oral)   Resp 18   Ht 5\' 8"  (1.727 m)   Wt 68.3 kg   SpO2 97%   BMI 22.89 kg/m  Vitals:   04/18/18 0631 04/18/18 0738 04/18/18 1135 04/18/18 1304  BP: (!) 101/59 (!) 93/58 100/61 (!) 103/55  Pulse: 63 (!) 57 (!) 104 68  Resp: 19 16 16 18   Temp:  (!) 97.5 F (36.4 C)  (!) 97.5 F (36.4 C)  TempSrc:  Oral  Oral  SpO2: 99% 99% 95% 97%  Weight:      Height:         General appearance: cachectic, fatigued, pale, slowed mentation and frail appearing Head: Normocephalic, without obvious abnormality, atraumatic Resp: diminished breath sounds bibasilar Cardio: irregularly irregular rhythm and no rub GI: soft, non-tender; bowel sounds normal; no masses,  no organomegaly Extremities: edema 1+ pretibial and presacral edema Neuro:  no asterixis  Labs: Basic Metabolic Panel: Recent Labs  Lab 04/14/18 1612 04/15/18 0133 04/16/18 0224 04/17/18 0248 04/18/18 0319  NA 136 135 134* 135 131*  K 5.1 5.1 4.8 4.7 4.6  CL 107 105 104 104 104  CO2 18* 17* 19* 20* 17*  GLUCOSE 89 103* 95 90 94  BUN 38* 43* 53* 65*  69*  CREATININE 2.00* 1.97* 2.27* 2.48* 2.44*  ALBUMIN 1.6* 1.7* 1.5* 1.3*  --   CALCIUM 9.3 9.4 9.0 8.8* 9.6  PHOS  --   --  4.9* 4.6  --    Liver Function Tests: Recent Labs  Lab 04/15/18 0133 04/16/18 0224 04/17/18 0248  AST 28 24 23   ALT 10 9 9   ALKPHOS 85 72 98  BILITOT 0.8 0.7 0.5  PROT 5.8* 5.2* 4.7*  ALBUMIN 1.7* 1.5* 1.3*   No results for input(s): LIPASE, AMYLASE in the last 168 hours. No results for  input(s): AMMONIA in the last 168 hours. CBC: Recent Labs  Lab 04/14/18 1612 04/15/18 0133 04/16/18 0224 04/17/18 0248 04/18/18 0319  WBC 23.6* 21.7* 22.5* 19.8* 18.6*  NEUTROABS 23.1* 20.1* 20.9* 17.9*  --   HGB 8.8* 9.6* 9.2* 8.2* 8.2*  HCT 28.6* 31.8* 29.1* 25.6* 25.5*  MCV 81.7 79.9* 76.4* 77.8* 77.3*  PLT 430* 483* 400 410* 369   PT/INR: @LABRCNTIP (inr:5) Cardiac Enzymes: )No results for input(s): CKTOTAL, CKMB, CKMBINDEX, TROPONINI in the last 168 hours. CBG: No results for input(s): GLUCAP in the last 168 hours.  Iron Studies:  Recent Labs  Lab 04/15/18 0133  IRON 7*  TIBC 125*  FERRITIN 1,019*    Xrays/Other Studies: Dg Chest Port 1 View  Result Date: 04/17/2018 CLINICAL DATA:  Respiratory failure and CHF. EXAM: PORTABLE CHEST 1 VIEW COMPARISON:  04/16/2018 FINDINGS: Stable mild cardiac enlargement. Stable consolidation of the left lower lobe with associated small left pleural effusion. Probable minimal right pleural effusion. Decrease in an probable CHF with mild residual interstitial edema suspected. Persistent left lower lung nodule measuring approximately 10-11 mm. IMPRESSION: 1. Stable left lower lobe consolidation and bilateral small pleural effusions, left greater than right. 2. Probable mild residual pulmonary interstitial edema. 3. Persistent nodule projecting over the left lower lung measuring approximately 10-11 mm. Electronically Signed   By: Aletta Edouard M.D.   On: 04/17/2018 08:07   CLINICAL DATA:  Acute kidney  injury  EXAM: RENAL / URINARY TRACT ULTRASOUND COMPLETE  COMPARISON:  None.  FINDINGS: Right Kidney:  Renal measurements: 12.6 x 9.0 x 9.0 cm = volume: 532 mL. Right kidney is enlarged with heterogeneous echotexture diffusely. Echogenic shadowing foci within the hilum could reflect vascular calcifications or nonobstructing ureteral stones.  Left Kidney:  Renal measurements: 10.7 x 4.0 x 4.6 cm = volume: 102 ML. Echogenicity within normal limits. No mass or hydronephrosis visualized.  Bladder:  Appears normal for degree of bladder distention.  IMPRESSION: Diffusely abnormal appearance of the right kidney which is enlarged with diffusely heterogeneous echotexture. Cannot exclude infiltrating mass such as lymphoma or renal cell carcinoma. Consider further evaluation with MRI or CT with IV contrast.  No hydronephrosis.   Electronically Signed   By: Rolm Baptise M.D.   On: 04/15/2018 00:16     Assessment/Plan: 1.  AKI/CKD stage 3- in setting of volume overload, NSAIDs, and diuresis.  She is not a candidate for dialysis and agree with Palliative care involvement.  If her renal function continues to deteriorate would recommend transition to hospice care 1. Cont to hold NSAIDs 2. Anasarca- she does have some proteinuria and marked hypoalbuminemia.  Will check SPEP/UPEP and quantify proteinuria.  Continue with IV lasix as needed for respiratory status and follow 3. Abnormal right kidney- worrisome for infiltrating mass such as lymphoma or RCC.  Agree with non-contrasted CT vs. MRI w/o contrast to further evaluate.  Agree with holding off on biopsy if the family does not wish to pursue chemo.  But diagnosis would help with GOC and EOL issues. 4. Severe protein and caloric malnutrition- supplement with Nepro and encourage po intake. 5. Acute respiratory failure with hypoxia- concerning for LLL pneumonia with pulmonary edema and effusions.  On cefepime and flagyl per  primary 6. Microcytic anemia- ?related to CKD or malignancy.  Iron stores are low with elevated ferritin (which can be related to malnutrition, infection, or malignancy).  Consider IV feraheme 510mg  x 2 doses and follow. 7. Disposition- poor overall prognosis.  Appreciate Palliative care assistance  as family seems amenable to transitioning to comfort care.   Nicole Lowery Lidie Glade 04/18/2018, 2:51 PM

## 2018-04-18 NOTE — Progress Notes (Addendum)
Patient sitting up in chair.  Appears better today, but tells me she feels bad in general.  She would like to try to have a bowel movement.  Discussed with Tech and RN.  Ducolax suppository ordered.  Talked with [name].  He does not want her to go to long term care as he is worried they will not work with her and that due to [place] restrictions - he would not see her for a month.  In his mind he is waiting for her to declare herself either [facility]hab).    We talked about her functionality, nutrition and will to live.  Will to live is difficult to assess in this demented patient.  Patient not eating but will drink protein drinks.  Assessment: 85 yo female. Acute on chronic heart failure Progressive dementia Acute on chronic renal failure ? Rt kidney abnormality on imaging  Recommendation  1.  I believe in the next 24 - 48 hours patient will "declare" herself and be appropriate for either acute rehab OR hospice house.  2.  PMT will follow up on 3/20 to support son in making decisions.  Florentina Jenny, PA-C Palliative Medicine Pager: [phone] 25 min.

## 2018-04-18 NOTE — Progress Notes (Signed)
Echocardiogram 04/18/2018: 1. The left ventricle has low normal systolic function, with an ejection fraction of 50-55%. The cavity size was normal. There is moderate concentric left ventricular hypertrophy. Left ventricular diastolic Doppler parameters are indeterminate due  irregular ventricular rhythm.  2. The right ventricle has normal systolic function. The cavity was normal. There is mildly increased right ventricular wall thickness.  3. Moderate, predominantly posteriorly located pericardial effusion. No echocardiographic signs of tamponade seen.  4. Moderate pleural effusion in the left lateral region.  Discussion regarding palliative care noted. Do not recommend invasive management for the above findings.

## 2018-04-18 NOTE — Care Management Important Message (Signed)
Important Message  Patient Details  Name: Nicole Lowery MRN: [id number] Date of Birth: 1926-03-26   Medicare Important Message Given:       Nicole Lowery 04/18/2018, 12:21 PM

## 2018-04-19 ENCOUNTER — Inpatient Hospital Stay (HOSPITAL_COMMUNITY): Payer: Medicare Other

## 2018-04-19 DIAGNOSIS — C799 Secondary malignant neoplasm of unspecified site: Secondary | ICD-10-CM

## 2018-04-19 LAB — IMMUNOFIXATION ELECTROPHORESIS
IgA: 323 mg/dL (ref 64–422)
IgG (Immunoglobin G), Serum: 899 mg/dL (ref 700–1600)
IgM (Immunoglobulin M), Srm: 162 mg/dL (ref 26–217)
Total Protein ELP: 4.2 g/dL — ABNORMAL LOW (ref 6.0–8.5)

## 2018-04-19 LAB — CULTURE, BLOOD (ROUTINE X 2)
Culture: NO GROWTH
Special Requests: ADEQUATE

## 2018-04-19 LAB — BASIC METABOLIC PANEL
Anion gap: 8 (ref 5–15)
BUN: 74 mg/dL — ABNORMAL HIGH (ref 8–23)
CO2: 19 mmol/L — AB (ref 22–32)
Calcium: 10.2 mg/dL (ref 8.9–10.3)
Chloride: 105 mmol/L (ref 98–111)
Creatinine, Ser: 2.49 mg/dL — ABNORMAL HIGH (ref 0.44–1.00)
GFR calc non Af Amer: 17 mL/min — ABNORMAL LOW (ref >60)
GFR, EST AFRICAN AMERICAN: 19 mL/min — AB (ref >60)
Glucose, Bld: 93 mg/dL (ref 70–99)
Potassium: 4.5 mmol/L (ref 3.5–5.1)
Sodium: 132 mmol/L — ABNORMAL LOW (ref 135–145)

## 2018-04-19 LAB — CBC
HCT: 26.9 % — ABNORMAL LOW (ref 36.0–46.0)
Hemoglobin: 8.5 g/dL — ABNORMAL LOW (ref 12.0–15.0)
MCH: 24.8 pg — ABNORMAL LOW (ref 26.0–34.0)
MCHC: 31.6 g/dL (ref 30.0–36.0)
MCV: 78.4 fL — ABNORMAL LOW (ref 80.0–100.0)
NRBC: 0 % (ref 0.0–0.2)
Platelets: 380 10*3/uL (ref 150–400)
RBC: 3.43 MIL/uL — ABNORMAL LOW (ref 3.87–5.11)
RDW: 19.1 % — ABNORMAL HIGH (ref 11.5–15.5)
WBC: 18.1 10*3/uL — ABNORMAL HIGH (ref 4.0–10.5)

## 2018-04-19 LAB — KAPPA/LAMBDA LIGHT CHAINS
Kappa free light chain: 129.9 mg/L — ABNORMAL HIGH (ref 3.3–19.4)
Kappa, lambda light chain ratio: 1.87 — ABNORMAL HIGH (ref 0.26–1.65)
Lambda free light chains: 69.5 mg/L — ABNORMAL HIGH (ref 5.7–26.3)

## 2018-04-19 MED ORDER — GELATIN ABSORBABLE 12-7 MM EX MISC
CUTANEOUS | Status: AC
  Filled 2018-04-19: qty 1

## 2018-04-19 MED ORDER — LORAZEPAM 2 MG/ML PO CONC: 0.5000 mg | ORAL | Status: DC | PRN

## 2018-04-19 MED ORDER — FENTANYL CITRATE (PF) 100 MCG/2ML IJ SOLN
INTRAMUSCULAR | Status: AC | PRN
  Administered 2018-04-19: 25 ug via INTRAVENOUS

## 2018-04-19 MED ORDER — MORPHINE SULFATE (CONCENTRATE) 10 MG/0.5ML PO SOLN: 5.0000 mg | ORAL | Status: DC | PRN

## 2018-04-19 MED ORDER — POLYVINYL ALCOHOL 1.4 % OP SOLN: 1.0000 [drp] | Freq: Four times a day (QID) | OPHTHALMIC | Status: DC | PRN

## 2018-04-19 MED ORDER — LORAZEPAM 0.5 MG PO TABS: 0.5000 mg | ORAL_TABLET | ORAL | Status: DC | PRN

## 2018-04-19 MED ORDER — MORPHINE SULFATE (PF) 2 MG/ML IV SOLN
2.0000 mg | INTRAVENOUS | Status: DC | PRN
  Administered 2018-04-20: 2 mg via INTRAVENOUS
  Filled 2018-04-19: qty 1

## 2018-04-19 MED ORDER — LORAZEPAM 2 MG/ML IJ SOLN: 0.5000 mg | INTRAMUSCULAR | Status: DC | PRN

## 2018-04-19 MED ORDER — BIOTENE DRY MOUTH MT LIQD: 15.0000 mL | OROMUCOSAL | Status: DC | PRN

## 2018-04-19 MED ORDER — HALOPERIDOL LACTATE 5 MG/ML IJ SOLN: 0.5000 mg | INTRAMUSCULAR | Status: DC | PRN

## 2018-04-19 MED ORDER — GLYCOPYRROLATE 1 MG PO TABS: 1.0000 mg | ORAL_TABLET | ORAL | Status: DC | PRN

## 2018-04-19 MED ORDER — GLYCOPYRROLATE 0.2 MG/ML IJ SOLN: 0.2000 mg | INTRAMUSCULAR | Status: DC | PRN

## 2018-04-19 MED ORDER — METOPROLOL TARTRATE 25 MG PO TABS
25.0000 mg | ORAL_TABLET | Freq: Two times a day (BID) | ORAL | Status: DC
  Administered 2018-04-19 – 2018-04-20 (×3): 25 mg via ORAL
  Filled 2018-04-19 (×3): qty 1

## 2018-04-19 MED ORDER — LIDOCAINE HCL (PF) 1 % IJ SOLN
INTRAMUSCULAR | Status: AC
  Filled 2018-04-19: qty 30

## 2018-04-19 MED ORDER — FENTANYL CITRATE (PF) 100 MCG/2ML IJ SOLN
INTRAMUSCULAR | Status: AC
  Filled 2018-04-19: qty 2

## 2018-04-19 MED ORDER — HALOPERIDOL LACTATE 2 MG/ML PO CONC
0.5000 mg | ORAL | Status: DC | PRN
  Filled 2018-04-19: qty 0.3

## 2018-04-19 MED ORDER — HALOPERIDOL 0.5 MG PO TABS: 0.5000 mg | ORAL_TABLET | ORAL | Status: DC | PRN

## 2018-04-19 NOTE — Progress Notes (Signed)
   04/19/18 2100  Clinical Encounter Type  Visited With Health care provider;Patient  Visit Type Initial;Spiritual support;Other (Comment) (comfort care)  Referral From Palliative care team  Spiritual Encounters  Spiritual Needs Emotional;Prayer  Stress Factors  Patient Stress Factors Major life changes;Loss of control;Health changes;Exhausted   Spoke w/ RN prior to [employer] w/ pt, RN affirmed that pt was awake, ok time to visit her, and that RN would be giving pt meds soon as well.  Met w/ pt, conversed only when asked questions, but was very clear when asked if she wanted to pray with me.  Wanted prayer for her health, noted that what her dr has been saying "wasn't good."  Pt wouldn't answer when asked what she had looked forward to when she wasn't in the hospital.    Prayed w/ pt, when finished, pt appeared to be dozing and RN had entered.  Affirmed to RN that I would be in hospital and available for paging (preferred overnight to Epic consult) through the night.  Myra Gianotti resident, [phone]

## 2018-04-19 NOTE — Progress Notes (Addendum)
Manufacturing engineer [facility])    Received request from Driscilla Moats, for residential hospice at [facility].  Patient and chart under review by [facility] physician, bed availability is pending at this time.  Spoke with son [name] via phone, answered questions r/t hospice philosophy and [employer].  He is aware we will update him once bed availability has been determined.    Please call with any hospice related questions/concerns.  Venia Carbon RN, BSN, [facility] Liaison (in [place] under Hospice and [place]) [phone] (main #)  ** Update @ 545pm- [place] will have a bed to offer on 3/21.  [facility] staff will coordinate with family to complete necessary paperwork and update staff once complete

## 2018-04-19 NOTE — Progress Notes (Signed)
CT scan revealed widely metastatic cancer.     Patient sleeping after kidney biopsy.    Son at bedside.  He is sad but relieved to have such a definitive answer to his questions.  He asks about prognosis.   We discussed residential hospice.  Recommendations:  Orders changed to reflect comfort care. All interventions not related to comfort discontinued. Discharge to [facility] as soon as a bed is available.  Florentina Jenny, PA-C Palliative Medicine Pager: [phone]  Time 15 min.

## 2018-04-19 NOTE — TOC Initial Note (Signed)
Transition of Care [facility]) - Initial/Assessment Note    Patient Details  Name: Nicole Lowery MRN: [id number] Date of Birth: 24-Feb-1926  Transition of Care [facility]) CM/SW Contact:    Eileen Stanford, LCSW Phone Number: 04/19/2018, 11:49 AM  Clinical Narrative:    CSW spoke with pt's son via telephone. Pt will go to hospice home.   Pt's son states he does not have a preference between [employer] or   [facility]. Pt's son wants CSW to start with [facility] as it is a little closer to home for them. CSW made referral to [facility] with [employer].         Expected Discharge Plan: [place] Barriers to Discharge: No Barriers Identified   Patient Goals and CMS Choice Patient states their goals for this hospitalization and ongoing recovery are:: N/A pt is hospice CMS Medicare.gov Compare Post Acute Care list provided to:: (N/A) Choice offered to / list presented to : NA  Expected Discharge Plan and Services Expected Discharge Plan: [place] In-house Referral: Clinical Social Work     Living arrangements for the past 2 months: Single Family Home                          Prior Living Arrangements/Services Living arrangements for the past 2 months: Single Family Home Lives with:: Adult Children Patient language and need for interpreter reviewed:: No Do you feel safe going back to the place where you live?: No      Need for Family Participation in Patient Care: No (Comment) Care giver support system in place?: Yes (comment)(Son)   Criminal Activity/Legal Involvement Pertinent to Current Situation/Hospitalization: No - Comment as needed  Activities of Daily Living Home Assistive Devices/Equipment: Raised toilet seat with rails, Cane (specify quad or straight)("hurricane" cane) ADL Screening (condition at time of admission) Patient's cognitive ability adequate to safely complete daily activities?: Yes Is the patient deaf or have  difficulty hearing?: No Does the patient have difficulty seeing, even when wearing glasses/contacts?: No Does the patient have difficulty concentrating, remembering, or making decisions?: Yes Patient able to express need for assistance with ADLs?: Yes Does the patient have difficulty dressing or bathing?: Yes Independently performs ADLs?: No Communication: Independent Dressing (OT): Needs assistance Is this a change from baseline?: Pre-admission baseline Grooming: Needs assistance Is this a change from baseline?: Pre-admission baseline Feeding: Independent Bathing: Needs assistance Is this a change from baseline?: Pre-admission baseline Toileting: Dependent Is this a change from baseline?: Pre-admission baseline In/Out Bed: Dependent(typically independent with device; not since last hospitalization) Is this a change from baseline?: Pre-admission baseline Walks in Home: Dependent(typically independent with device) Is this a change from baseline?: Pre-admission baseline Does the patient have difficulty walking or climbing stairs?: Yes Weakness of Legs: Both Weakness of Arms/Hands: None  Permission Sought/Granted Permission sought to share information with : Facility Sport and exercise psychologist, Family Supports    Share Information with NAME: [name]  Permission granted to share info w AGENCY: Optometrist, [place] granted to share info w Relationship: Son     Emotional Assessment Appearance:: Appears stated age Attitude/Demeanor/Rapport: Unable to Assess Affect (typically observed): Unable to Assess Orientation: : Oriented to Self Alcohol / Substance Use: Not Applicable Psych Involvement: No (comment)  Admission diagnosis:  SIRS (systemic inflammatory response syndrome) (HCC) [R65.10] AKI (acute kidney injury) ([place]) [N17.9] Patient Active Problem List   Diagnosis Date Noted  . AKI (acute kidney injury) (  [place])   . Palliative care encounter   . Pressure  injury of skin 04/15/2018  . Malnutrition of moderate degree 04/15/2018  . Acute respiratory failure with hypoxia ([place]) 04/14/2018  . Renal insufficiency 04/14/2018  . Normocytic anemia 04/14/2018  . Unspecified atrial fibrillation ([place]) 04/14/2018  . SIRS (systemic inflammatory response syndrome) ([place]) 04/14/2018  . Dementia ([place]) 04/14/2018  . Hypervolemia 04/14/2018  . Pleural effusion 04/14/2018  . Protein calorie malnutrition ([place]) 04/14/2018   PCP:  Default, Provider, MD Pharmacy:   [id number] [place], [address] [place] [address] Phone: [phone] Fax: [phone]     Social Determinants of Health (SDOH) Interventions    Readmission Risk Interventions No flowsheet data found.

## 2018-04-19 NOTE — Care Management Important Message (Signed)
Important Message  Patient Details  Name: Nicole Lowery MRN: [id number] Date of Birth: 24-Nov-1926   Medicare Important Message Given:  Yes    Lucill Mauck Montine Circle 04/19/2018, 3:23 PM

## 2018-04-19 NOTE — Procedures (Signed)
Interventional Radiology Procedure Note  Procedure: US guided biopsy, liver mass.  .  Complications: None Recommendations:  - Ok to shower tomorrow - Do not submerge for 7 days - Routine care   Signed,  Dulcy Fanny. Earleen Newport, DO

## 2018-04-19 NOTE — Progress Notes (Signed)
PROGRESS NOTE    Nicole Lowery   PJK:[id number]  DOB: [birth year]  DOA: 04/14/2018 PCP: Default, Provider, MD   Brief Narrative:  Nicole Lowery is a 85 y.o.femalewith medical history significant fordementia, admitted to the hospital in February with atrial fibrillation and acute CHF, now presenting to the ED for evaluation of progressive loss of appetite, generalized weakness and lower extremity edema. The patient was admitted in [facility] for pedal edema in February and was given diuretic. According to her son, she has been having edema of her lower extremity beginning in February and she did improve after discharge after diuretics were given however her legs have swollen up again worse than before and she is no longer able to put her shoes on. Patient had been losing weight.   For the past 2 weeks her oral intake has declined suddenly again.   In the ED, temp noted to be temp was 99.2, pulse ox 80% on room air, WBC count 23.6, albumin 1.6, BUN 38 and creatinine 2.0.  Lower extremities were quite edematous. Chest x-ray revealed bilateral pleural effusions and compressive atelectasis.  She was also noted to have atrial fibrillation which is a new diagnosis for her.  Subjective:  No complaints today. Still eating very poorly per RN and son.     Assessment & Plan:   Principal Problem:   Acute respiratory failure with hypoxia - LLL pneumonia and pulmonary edema - also has pleural effusions and compressive atelectasis - resp virus panel is negative, MRSA PCR neg-   - has been given Lasix and some improvement in effusions - she is no longer requiring O2 - 2 D ECHO showing an normal EF- diastolic dysfunction cannot be determined  Active Problems:  Leukocytosis- LLL pneumonia Strep viridans in 1/ 4 bottles of blood cultures - started on Vanc, Cefepime and Flagyl  - Procalcitonin 4.39 > 17.36> 15.51> 11.44 - lactic acid was 2.3>> 3.1>> 2.5 - strep viridans is  likely contamiant- repeated cultures are negative - Vanc d/c'd on 3/18  - cont Cefepime and Flagyl for possible pneumonia - WBC count was 23.6 (mostly neutrophils) and has improved to 18.6 today- would like to complete a 7 day course  AKI- CKD 3- GFR was 30 in February (labs received from office)  -  Cr has worsened steadily from 2.0 on admission to 2.48- GFR now 17 -   Lasix and Vanc d/c'd and Cr has stabilized  - she is on Mobic BID and PRN Advil at home which is on hold - she has protein in her urine but does not have nephrotic range proteinuria - her son does not want her to be on dialysis and thus I have not contacted nephrology  Metastatic cancer, likely renal - renal ultrasound from 3/16 reveals> Diffusely abnormal appearance of the right kidney which is enlarged with diffusely heterogeneous echotexture. Cannot exclude infiltrating mass such as lymphoma or renal cell carcinoma - CT scan today showing>>  Small to moderate-sized pericardial effusion, Multiple rounded hypodense lesions of the liver are consistent with metastatic disease. The right kidney is massively enlarged and heterogeneous, measuring up to 11 cm and demonstrating probable diffuse involvement by tumor. Significant retroperitoneal lymphadenopathy and multiple peritoneal masses  - she has undergone a biopsy of the liver lesion today   Anorexia/ anasarca/ hypoalbuminemia/ Lethargy -  Per son, has been losing weight, has had a poor appetite and has been more fatigued - mostly sleeping in the hospital and not eating much - this  is likely due to above cancer with metastatsis - Alb 1.3,  normal TSH   Pedal edema - TEDS    Normocytic anemia - anemia of chronic disease- mild folic acid deficiency- will replace    Ref. Range 04/15/2018 01:33  Iron Latest Ref Range: 28 - 170 ug/dL 7 (L)  UIBC Latest Units: ug/dL 118  TIBC Latest Ref Range: 250 - 450 ug/dL 125 (L)  Saturation Ratios Latest Ref Range: 10.4 - 31.8 % 6 (L)    Ferritin Latest Ref Range: 11 - 307 ng/mL 1,019 (H)  Folate Latest Ref Range: >5.9 ng/mL 5.1 (L)  Vitamin B12 Latest Ref Range: 180 - 914 pg/mL 4,847 (H)      Unspecified atrial fibrillation - new diagnosis - started IV Metoprolol - rate is controlled today    Dementia  - she is at baseline- cont Aricept and Namenda  Time spent in minutes: 45 min- time spent in extensive conversations with son, IR and case management DVT prophylaxis: SCDs Code Status: DNR- discussed with son Family Communication: son Disposition Plan: monitor for clinical improvement Consultants:   none Procedures:   2 D ECHO 1. The left ventricle has low normal systolic function, with an ejection fraction of 50-55%. The cavity size was normal. There is moderate concentric left ventricular hypertrophy. Left ventricular diastolic Doppler parameters are indeterminate due  irregular ventricular rhythm.  2. The right ventricle has normal systolic function. The cavity was normal. There is mildly increased right ventricular wall thickness.  3. Moderate, predominantly posteriorly located pericardial effusion. No echocardiographic signs of tamponade seen.  4. Moderate pleural effusion in the left lateral region.  Antimicrobials:  Anti-infectives (From admission, onward)   Start     Dose/Rate Route Frequency Ordered Stop   04/17/18 0800  metroNIDAZOLE (FLAGYL) IVPB 500 mg  Status:  Discontinued     500 mg 100 mL/hr over 60 Minutes Intravenous Every 8 hours 04/17/18 0322 04/19/18 1439   04/16/18 2200  vancomycin (VANCOCIN) IVPB 750 mg/150 ml premix  Status:  Discontinued     750 mg 150 mL/hr over 60 Minutes Intravenous Every 48 hours 04/16/18 1422 04/17/18 1137   04/14/18 2000  metroNIDAZOLE (FLAGYL) IVPB 500 mg  Status:  Discontinued     500 mg 100 mL/hr over 60 Minutes Intravenous Every 8 hours 04/14/18 1929 04/17/18 0322   04/14/18 1945  vancomycin (VANCOCIN) 1,250 mg in sodium chloride 0.9 % 250 mL IVPB     1,250  mg 166.7 mL/hr over 90 Minutes Intravenous  Once 04/14/18 1943 04/15/18 0700   04/14/18 1945  ceFEPIme (MAXIPIME) 1 g in sodium chloride 0.9 % 100 mL IVPB  Status:  Discontinued     1 g 200 mL/hr over 30 Minutes Intravenous Every 24 hours 04/14/18 1943 04/19/18 1439   04/14/18 1942  vancomycin variable dose per unstable renal function (pharmacist dosing)  Status:  Discontinued      Does not apply See admin instructions 04/14/18 1943 04/15/18 1015   04/14/18 1930  ceFEPIme (MAXIPIME) 2 g in sodium chloride 0.9 % 100 mL IVPB  Status:  Discontinued     2 g 200 mL/hr over 30 Minutes Intravenous  Once 04/14/18 1929 04/14/18 1946   04/14/18 1930  vancomycin (VANCOCIN) IVPB 1000 mg/200 mL premix  Status:  Discontinued     1,000 mg 200 mL/hr over 60 Minutes Intravenous  Once 04/14/18 1929 04/14/18 1945   04/14/18 1915  cefTRIAXone (ROCEPHIN) 1 g in sodium chloride 0.9 % 100 mL IVPB  Status:  Discontinued     1 g 200 mL/hr over 30 Minutes Intravenous  Once 04/14/18 1903 04/14/18 1925   04/14/18 1915  azithromycin (ZITHROMAX) 500 mg in sodium chloride 0.9 % 250 mL IVPB  Status:  Discontinued     500 mg 250 mL/hr over 60 Minutes Intravenous  Once 04/14/18 1903 04/14/18 1925       Objective: Vitals:   04/19/18 1350 04/19/18 1355 04/19/18 1400 04/19/18 1405  BP: (!) 97/58 (!) 92/45 (!) 94/43 (!) 104/51  Pulse: 79 70 76 65  Resp: 20 14 16 13   Temp:      TempSrc:      SpO2: 96% 99% 98% 94%  Weight:      Height:        Intake/Output Summary (Last 24 hours) at 04/19/2018 1503 Last data filed at 04/19/2018 0510 Gross per 24 hour  Intake 3 ml  Output 650 ml  Net -647 ml   Filed Weights   04/17/18 0610 04/18/18 0407 04/19/18 0347  Weight: 65.4 kg 68.3 kg 68.3 kg    Examination: General exam: Appears comfortable  HEENT: PERRLA, oral mucosa moist, no sclera icterus or thrush Respiratory system: Clear to auscultation. Respiratory effort normal. Cardiovascular system: S1 & S2 heard,  No  murmurs  Gastrointestinal system: Abdomen soft, non-tender, nondistended. Normal bowel sounds   Central nervous system: Alert and oriented to person only. No focal neurological deficits. Extremities: No cyanosis, clubbing - 3 + pitting edema Skin: No rashes or ulcers Psychiatry:  Mood & affect appropriate.    Data Reviewed: I have personally reviewed following labs and imaging studies  CBC: Recent Labs  Lab 04/14/18 1612 04/15/18 0133 04/16/18 0224 04/17/18 0248 04/18/18 0319 04/19/18 0257  WBC 23.6* 21.7* 22.5* 19.8* 18.6* 18.1*  NEUTROABS 23.1* 20.1* 20.9* 17.9*  --   --   HGB 8.8* 9.6* 9.2* 8.2* 8.2* 8.5*  HCT 28.6* 31.8* 29.1* 25.6* 25.5* 26.9*  MCV 81.7 79.9* 76.4* 77.8* 77.3* 78.4*  PLT 430* 483* 400 410* 369 716   Basic Metabolic Panel: Recent Labs  Lab 04/15/18 0133 04/16/18 0224 04/17/18 0248 04/18/18 0319 04/19/18 0257  NA 135 134* 135 131* 132*  K 5.1 4.8 4.7 4.6 4.5  CL 105 104 104 104 105  CO2 17* 19* 20* 17* 19*  GLUCOSE 103* 95 90 94 93  BUN 43* 53* 65* 69* 74*  CREATININE 1.97* 2.27* 2.48* 2.44* 2.49*  CALCIUM 9.4 9.0 8.8* 9.6 10.2  MG  --  1.9 2.0  --   --   PHOS  --  4.9* 4.6  --   --    GFR: Estimated Creatinine Clearance: 15.5 mL/min (A) (by C-G formula based on SCr of 2.49 mg/dL (H)). Liver Function Tests: Recent Labs  Lab 04/14/18 1612 04/15/18 0133 04/16/18 0224 04/17/18 0248  AST 29 28 24 23   ALT 9 10 9 9   ALKPHOS 76 85 72 98  BILITOT 0.4 0.8 0.7 0.5  PROT 5.4* 5.8* 5.2* 4.7*  ALBUMIN 1.6* 1.7* 1.5* 1.3*   No results for input(s): LIPASE, AMYLASE in the last 168 hours. No results for input(s): AMMONIA in the last 168 hours. Coagulation Profile: Recent Labs  Lab 04/14/18 1952  INR 1.2   Cardiac Enzymes: No results for input(s): CKTOTAL, CKMB, CKMBINDEX, TROPONINI in the last 168 hours. BNP (last 3 results) No results for input(s): PROBNP in the last 8760 hours. HbA1C: No results for input(s): HGBA1C in the last 72  hours. CBG: No results  for input(s): GLUCAP in the last 168 hours. Lipid Profile: No results for input(s): CHOL, HDL, LDLCALC, TRIG, CHOLHDL, LDLDIRECT in the last 72 hours. Thyroid Function Tests: No results for input(s): TSH, T4TOTAL, FREET4, T3FREE, THYROIDAB in the last 72 hours. Anemia Panel: No results for input(s): VITAMINB12, FOLATE, FERRITIN, TIBC, IRON, RETICCTPCT in the last 72 hours. Urine analysis:    Component Value Date/Time   COLORURINE AMBER (A) 04/14/2018 1748   APPEARANCEUR HAZY (A) 04/14/2018 1748   LABSPEC 1.024 04/14/2018 1748   PHURINE 5.0 04/14/2018 1748   GLUCOSEU NEGATIVE 04/14/2018 1748   HGBUR SMALL (A) 04/14/2018 1748   BILIRUBINUR NEGATIVE 04/14/2018 1748   [place] 04/14/2018 1748   PROTEINUR 100 (A) 04/14/2018 1748   NITRITE NEGATIVE 04/14/2018 1748   LEUKOCYTESUR SMALL (A) 04/14/2018 1748   Sepsis Labs: @LABRCNTIP (procalcitonin:4,lacticidven:4) ) Recent Results (from the past 240 hour(s))  Urine culture     Status: None   Collection Time: 04/14/18  5:48 PM  Result Value Ref Range Status   Specimen Description URINE, CATHETERIZED  Final   Special Requests NONE  Final   Culture   Final    NO GROWTH Performed at [facility], [place] [address], [place], [place] [address]    Report Status 04/15/2018 FINAL  Final  Blood culture (routine x 2)     Status: None   Collection Time: 04/14/18  7:33 PM  Result Value Ref Range Status   Specimen Description BLOOD RIGHT ANTECUBITAL  Final   Special Requests   Final    BOTTLES DRAWN AEROBIC AND ANAEROBIC Blood Culture adequate volume   Culture   Final    NO GROWTH 5 DAYS Performed at [facility], [place] [address], [place], [place] [address]    Report Status 04/19/2018 FINAL  Final  Blood culture (routine x 2)     Status: Abnormal   Collection Time: 04/14/18  7:42 PM  Result Value Ref Range Status   Specimen Description BLOOD LEFT ANTECUBITAL  Final   Special Requests   Final     BOTTLES DRAWN AEROBIC AND ANAEROBIC Blood Culture adequate volume   Culture  Setup Time   Final    GRAM POSITIVE COCCI ANAEROBIC BOTTLE ONLY CRITICAL RESULT CALLED TO, READ BACK BY AND VERIFIED WITH: G.ABBOTT,PHARMD [id number] 04/26/2018 M.CAMPBELL    Culture (A)  Final    VIRIDANS STREPTOCOCCUS THE SIGNIFICANCE OF ISOLATING THIS ORGANISM FROM A SINGLE SET OF BLOOD CULTURES WHEN MULTIPLE SETS ARE DRAWN IS UNCERTAIN. PLEASE NOTIFY THE MICROBIOLOGY DEPARTMENT WITHIN ONE WEEK IF SPECIATION AND SENSITIVITIES ARE REQUIRED. Performed at [facility], [place] [address] [place], [place] [address]    Report Status 04/17/2018 FINAL  Final  Blood Culture ID Panel (Reflexed)     Status: Abnormal   Collection Time: 04/14/18  7:42 PM  Result Value Ref Range Status   Enterococcus species NOT DETECTED NOT DETECTED Final   Listeria monocytogenes NOT DETECTED NOT DETECTED Final   Staphylococcus species NOT DETECTED NOT DETECTED Final   Staphylococcus aureus (BCID) NOT DETECTED NOT DETECTED Final   Streptococcus species DETECTED (A) NOT DETECTED Final    Comment: Not Enterococcus species, Streptococcus agalactiae, Streptococcus pyogenes, or Streptococcus pneumoniae. CRITICAL RESULT CALLED TO, READ BACK BY AND VERIFIED WITH: G.ABBOTT,PHARMD [id number] 04/26/2018 M.CAMPBELL    Streptococcus agalactiae NOT DETECTED NOT DETECTED Final   Streptococcus pneumoniae NOT DETECTED NOT DETECTED Final   Streptococcus pyogenes NOT DETECTED NOT DETECTED Final   Acinetobacter baumannii NOT DETECTED  NOT DETECTED Final   Enterobacteriaceae species NOT DETECTED NOT DETECTED Final   Enterobacter cloacae complex NOT DETECTED NOT DETECTED Final   Escherichia coli NOT DETECTED NOT DETECTED Final   Klebsiella oxytoca NOT DETECTED NOT DETECTED Final   Klebsiella pneumoniae NOT DETECTED NOT DETECTED Final   Proteus species NOT DETECTED NOT DETECTED Final   Serratia marcescens NOT DETECTED NOT DETECTED Final   Haemophilus influenzae  NOT DETECTED NOT DETECTED Final   Neisseria meningitidis NOT DETECTED NOT DETECTED Final   Pseudomonas aeruginosa NOT DETECTED NOT DETECTED Final   Candida albicans NOT DETECTED NOT DETECTED Final   Candida glabrata NOT DETECTED NOT DETECTED Final   Candida krusei NOT DETECTED NOT DETECTED Final   Candida parapsilosis NOT DETECTED NOT DETECTED Final   Candida tropicalis NOT DETECTED NOT DETECTED Final    Comment: Performed at [facility], [place] [address], [place], [place] [address]  MRSA PCR Screening     Status: None   Collection Time: 04/14/18  9:47 PM  Result Value Ref Range Status   MRSA by PCR NEGATIVE NEGATIVE Final    Comment:        The GeneXpert MRSA Assay (FDA approved for NASAL specimens only), is one component of a comprehensive MRSA colonization surveillance program. It is not intended to diagnose MRSA infection nor to guide or monitor treatment for MRSA infections. Performed at [facility], [place] [address] [place], [place] [address]   Respiratory Panel by PCR     Status: None   Collection Time: 04/14/18 10:39 PM  Result Value Ref Range Status   Adenovirus NOT DETECTED NOT DETECTED Final   Coronavirus 229E NOT DETECTED NOT DETECTED Final    Comment: (NOTE) The Coronavirus on the Respiratory Panel, DOES NOT test for the novel  Coronavirus (2019 nCoV)    Coronavirus HKU1 NOT DETECTED NOT DETECTED Final   Coronavirus NL63 NOT DETECTED NOT DETECTED Final   Coronavirus OC43 NOT DETECTED NOT DETECTED Final   Metapneumovirus NOT DETECTED NOT DETECTED Final   Rhinovirus / Enterovirus NOT DETECTED NOT DETECTED Final   Influenza A NOT DETECTED NOT DETECTED Final   Influenza B NOT DETECTED NOT DETECTED Final   Parainfluenza Virus 1 NOT DETECTED NOT DETECTED Final   Parainfluenza Virus 2 NOT DETECTED NOT DETECTED Final   Parainfluenza Virus 3 NOT DETECTED NOT DETECTED Final   Parainfluenza Virus 4 NOT DETECTED NOT DETECTED Final   Respiratory Syncytial Virus  NOT DETECTED NOT DETECTED Final   Bordetella pertussis NOT DETECTED NOT DETECTED Final   Chlamydophila pneumoniae NOT DETECTED NOT DETECTED Final   Mycoplasma pneumoniae NOT DETECTED NOT DETECTED Final    Comment: Performed at [facility], [place]. [address] [place], [place] [address]  Culture, blood (routine x 2)     Status: None (Preliminary result)   Collection Time: 04/16/18  8:30 AM  Result Value Ref Range Status   Specimen Description BLOOD RIGHT HAND  Final   Special Requests   Final    BOTTLES DRAWN AEROBIC ONLY Blood Culture adequate volume   Culture   Final    NO GROWTH 3 DAYS Performed at [facility], [place] [address] [place], [place] [address]    Report Status PENDING  Incomplete  Culture, blood (routine x 2)     Status: None (Preliminary result)   Collection Time: 04/16/18  8:35 AM  Result Value Ref Range Status   Specimen Description BLOOD LEFT HAND  Final  Special Requests   Final    BOTTLES DRAWN AEROBIC AND ANAEROBIC Blood Culture adequate volume   Culture   Final    NO GROWTH 3 DAYS Performed at [facility], [place] [address], [place], [place] [address]    Report Status PENDING  Incomplete         Radiology Studies: Ct Abdomen Pelvis Wo Contrast  Result Date: 04/18/2018 CLINICAL DATA:  Abdominal pain, loss of appetite, generalized weakness, worsening renal function and edema. Elevated white blood cell count. Renal ultrasound 4 days ago demonstrated a diffusely enlarged and heterogeneous appearing right kidney with appearance suspicious for diffuse tumor. EXAM: CT ABDOMEN AND PELVIS WITHOUT CONTRAST TECHNIQUE: Multidetector CT imaging of the abdomen and pelvis was performed following the standard protocol without IV contrast. COMPARISON:  Renal ultrasound on 04/14/2018 FINDINGS: Lower chest: Small to moderate-sized pericardial effusion. Small bilateral pleural effusions with associated bibasilar atelectasis. Discrete 12 mm pulmonary nodule in the  left lower lobe is suspicious for metastasis. Scarring versus nodularity in the inferior lingula with 2 separate areas of adjacent nodularity measuring approximately 0.9 and 1.1 cm. Hepatobiliary: Multiple rounded hypodense lesions of the liver are consistent with metastatic disease. The largest in the dome of the liver measures approximately 3.1 cm. There are multiple other lesions identified in the right lobe and at the juncture of right and left lobes. No evidence of biliary ductal dilatation. The gallbladder appears unremarkable. Pancreas: Unremarkable. No pancreatic ductal dilatation or surrounding inflammatory changes. Spleen: Normal in size without focal abnormality. Adrenals/Urinary Tract: The right kidney is massively enlarged and heterogeneous, measuring up to 11 cm and demonstrating probable diffuse involvement by tumor. There appears to be lobulated tumor extending up from the right kidney towards the liver or potentially involvement of the right adrenal gland. The left kidney appears fairly unremarkable by unenhanced CT. The bladder is decompressed by a Foley catheter. Stomach/Bowel: No evidence of bowel obstruction. The stomach is moderately distended. No free air. Vascular/Lymphatic: Calcified plaque in the abdominal aorta and iliac arteries without evidence of aneurysmal disease. Numerous enlarged retroperitoneal lymph nodes surround the aorta. The largest is a roughly 2.5 cm left para-aortic node. Lymph nodes are also present anterior to the aorta and in the aortocaval region. Reproductive: Uterus and bilateral adnexa are unremarkable. Other: Additional multitude peritoneal masses with the largest in the left lower quadrant measuring 2.7 x 3.2 cm and lying just deep to the abdominal wall. There are numerous other smaller nodules seen throughout the peritoneal cavity. Associated small amount of dependent ascites in the pelvis. Diffuse body wall edema is consistent with anasarca. Musculoskeletal: No  bony lesions or fractures identified. IMPRESSION: 1. Evidence of widespread malignancy with diffuse tumor engulfing the entire right kidney, multiple hepatic metastases, at least 1 and possibly 3 metastatic lesions at the left lung base, significant retroperitoneal lymphadenopathy and multiple peritoneal masses. There also is tumor extension superior to the right kidney and/or involvement of the right adrenal gland. Findings are suggestive of metastatic urothelial carcinoma, metastatic renal cell carcinoma or lymphoma. 2. Small to moderate pericardial effusion. 3. Small bilateral pleural effusions. 4. Small amount of ascites in the pelvis and diffuse body wall edema consistent with anasarca. Electronically Signed   By: Aletta Edouard M.D.   On: 04/18/2018 16:40   US Biopsy (liver)  Result Date: 04/19/2018 INDICATION: 85 year old female with a history of liver masses, concern for metastatic carcinoma EXAM: ULTRASOUND-GUIDED BIOPSY RIGHT LIVER LOBE MASS MEDICATIONS: None. ANESTHESIA/SEDATION: Moderate (conscious) sedation was employed  during this procedure. A total of Versed 0 mg and Fentanyl 25 mcg was administered intravenously. The patient's level of consciousness and vital signs were monitored continuously by radiology nursing throughout the procedure under my direct supervision. FLUOROSCOPY TIME:  NONE COMPLICATIONS: None immediate. PROCEDURE: Informed written consent was obtained from the patient after a thorough discussion of the procedural risks, benefits and alternatives. All questions were addressed. Maximal Sterile Barrier Technique was utilized including caps, mask, sterile gowns, sterile gloves, sterile drape, hand hygiene and skin antiseptic. A timeout was performed prior to the initiation of the procedure Ultrasound survey of the right liver lobe performed with images stored and sent to PACs. The right lower thorax/right upper abdomen was prepped with chlorhexidine in a sterile fashion, and a  sterile drape was applied covering the operative field. A sterile gown and sterile gloves were used for the procedure. Local anesthesia was provided with 1% Lidocaine. Once the patient is prepped and draped sterilely and the skin and subcutaneous tissues were generously infiltrated with 1% lidocaine, a small stab incision was made with an 11 blade scalpel. A 17 gauge introducer needle was advanced under ultrasound guidance in an intercostal location into the right liver lobe, targeting right liver mass. The stylet was removed, and 4 separate 18 gauge core biopsy were retrieved. Samples were placed into formalin for transportation to the lab. Gel-Foam pledgets were then infused with a small amount of saline for assistance with hemostasis. The needle was removed, and a final ultrasound image was performed. The patient tolerated the procedure well and remained hemodynamically stable throughout. No complications were encountered and no significant blood loss was encounter. IMPRESSION: Status post ultrasound-guided liver mass biopsy. Tissue specimen sent to pathology for complete histopathologic analysis. Signed, Dulcy Fanny. Dellia Nims, RPVI [facility] Radiology Electronically Signed   By: Corrie Mckusick D.O.   On: 04/19/2018 14:32      Scheduled Meds:  feeding supplement (ENSURE ENLIVE)  237 mL Oral BID BM   feeding supplement (PRO-STAT SUGAR FREE 64)  30 mL Oral BID   gelatin adsorbable       lidocaine (PF)       mouth rinse  15 mL Mouth Rinse BID   metoprolol tartrate  25 mg Oral BID   sodium chloride flush  3 mL Intravenous Q12H   Continuous Infusions:  sodium chloride       LOS: 4 days      Debbe Odea, MD [facility] Pager: [email or web address] Password [email or web address] 04/19/2018, 3:03 PM

## 2018-04-19 NOTE — Consult Note (Signed)
Chief Complaint: Patient was seen in consultation today for liver lesion biopsy Chief Complaint  Patient presents with  . Weakness   at the request of Dr Wynelle Cleveland   Supervising Physician: Corrie Mckusick  Patient Status: [facility] - In-pt  History of Present Illness: Nicole Lowery is a 85 y.o. female   Loss of appetite Wt loss General weakness Lower extremity edema  Diuretics---no help for edema  To ED with SOB and confusion Work up includes imaging CT: IMPRESSION: 1. Evidence of widespread malignancy with diffuse tumor engulfing the entire right kidney, multiple hepatic metastases, at least 1 and possibly 3 metastatic lesions at the left lung base, significant retroperitoneal lymphadenopathy and multiple peritoneal masses. There also is tumor extension superior to the right kidney and/or involvement of the right adrenal gland. Findings are suggestive of metastatic urothelial carcinoma, metastatic renal cell carcinoma or lymphoma. 2. Small to moderate pericardial effusion. 3. Small bilateral pleural effusions. 4. Small amount of ascites in the pelvis and diffuse body wall edema consistent with anasarca.  Request now for biopsy-- tissue diagnosis Imaging reviewed with Dr Henrene Dodge liver lesion biopsy  Past Medical History:  Diagnosis Date  . Cataracts, bilateral   . GERD (gastroesophageal reflux disease)     History reviewed. No pertinent surgical history.  Allergies: Patient has no known allergies.  Medications: Prior to Admission medications   Medication Sig Start Date End Date Taking? Authorizing Provider  aspirin EC 81 MG tablet Take 81 mg by mouth daily.   Yes [provider]  Calcium Carb-Cholecalciferol (CALCIUM 500 +D PO) Take 1 tablet by mouth 2 (two) times daily.   Yes [provider]  donepezil (ARICEPT) 10 MG tablet Take 10 mg by mouth daily. 02/06/18  Yes [provider]  dorzolamide (TRUSOPT) 2 % ophthalmic  solution Place 1 drop into the left eye 2 (two) times daily.   Yes [provider]  esomeprazole (NEXIUM) 20 MG capsule Take 20 mg by mouth daily.    Yes [provider]  ibuprofen (ADVIL,MOTRIN) 200 MG tablet Take 400 mg by mouth every 6 (six) hours as needed for headache (pain).   Yes [provider]  latanoprost (XALATAN) 0.005 % ophthalmic solution Place 1 drop into both eyes at bedtime. 03/27/18  Yes [provider]  meloxicam (MOBIC) 15 MG tablet Take 7.5 mg by mouth 2 (two) times daily. 03/19/18  Yes [provider]  memantine (NAMENDA XR) 28 MG CP24 24 hr capsule Take 28 mg by mouth daily. 03/19/18  Yes [provider]  metoprolol succinate (TOPROL-XL) 50 MG 24 hr tablet Take 50 mg by mouth daily. 03/09/18  Yes [provider]  vitamin B-12 (CYANOCOBALAMIN) 1000 MCG tablet Take 1,000 mcg by mouth daily.   Yes [provider]  Vitamin D, Cholecalciferol, 50 MCG (2000 UT) CAPS Take 2,000 Units by mouth daily.   Yes [provider]     Family History  Problem Relation Age of Onset  . Hypertension Other     Social History   Socioeconomic History  . Marital status: Single    Spouse name: Not on file  . Number of children: Not on file  . Years of education: Not on file  . Highest education level: Not on file  Occupational History  . Not on file  Social Needs  . Financial resource strain: Not on file  . Food insecurity:    Worry: Not on file    Inability: Not on file  .  Transportation needs:    Medical: Not on file    Non-medical: Not on file  Tobacco Use  . Smoking status: Never Smoker  . Smokeless tobacco: Never Used  Substance and Sexual Activity  . Alcohol use: Never    Frequency: Never  . Drug use: Never  . Sexual activity: Not on file  Lifestyle  . Physical activity:    Days per week: Not on file    Minutes per session: Not on file  . Stress: Not on file  Relationships  . Social  connections:    Talks on phone: Not on file    Gets together: Not on file    Attends religious service: Not on file    Active member of club or organization: Not on file    Attends meetings of clubs or organizations: Not on file    Relationship status: Not on file  Other Topics Concern  . Not on file  Social History Narrative  . Not on file    Review of Systems: A 12 point ROS discussed and pertinent positives are indicated in the HPI above.  All other systems are negative.  Review of Systems  Constitutional: Positive for activity change, appetite change, fatigue and unexpected weight change.  Respiratory: Positive for shortness of breath.   Cardiovascular: Negative for chest pain.  Gastrointestinal: Negative for abdominal pain.  Neurological: Positive for weakness.  Psychiatric/Behavioral: Positive for decreased concentration.    Vital Signs: BP 103/66 (BP Location: Right Arm)   Pulse 69   Temp 98 F (36.7 C) (Oral)   Resp 18   Ht 5\' 8"  (1.727 m)   Wt 150 lb 9.2 oz (68.3 kg)   SpO2 95%   BMI 22.89 kg/m   Physical Exam Vitals signs reviewed.  Cardiovascular:     Rate and Rhythm: Normal rate. Rhythm irregular.  Pulmonary:     Effort: Pulmonary effort is normal.     Breath sounds: Normal breath sounds.  Abdominal:     General: Bowel sounds are normal.  Musculoskeletal: Normal range of motion.  Skin:    General: Skin is warm and dry.  Neurological:     Mental Status: She is alert and oriented to person, place, and time.  Psychiatric:        Mood and Affect: Mood normal.        Behavior: Behavior normal.        Thought Content: Thought content normal.        Judgment: Judgment normal.     Imaging: Ct Abdomen Pelvis Wo Contrast  Result Date: 04/18/2018 CLINICAL DATA:  Abdominal pain, loss of appetite, generalized weakness, worsening renal function and edema. Elevated white blood cell count. Renal ultrasound 4 days ago demonstrated a diffusely enlarged and  heterogeneous appearing right kidney with appearance suspicious for diffuse tumor. EXAM: CT ABDOMEN AND PELVIS WITHOUT CONTRAST TECHNIQUE: Multidetector CT imaging of the abdomen and pelvis was performed following the standard protocol without IV contrast. COMPARISON:  Renal ultrasound on 04/14/2018 FINDINGS: Lower chest: Small to moderate-sized pericardial effusion. Small bilateral pleural effusions with associated bibasilar atelectasis. Discrete 12 mm pulmonary nodule in the left lower lobe is suspicious for metastasis. Scarring versus nodularity in the inferior lingula with 2 separate areas of adjacent nodularity measuring approximately 0.9 and 1.1 cm. Hepatobiliary: Multiple rounded hypodense lesions of the liver are consistent with metastatic disease. The largest in the dome of the liver measures approximately 3.1 cm. There are multiple other lesions identified in the right lobe  and at the juncture of right and left lobes. No evidence of biliary ductal dilatation. The gallbladder appears unremarkable. Pancreas: Unremarkable. No pancreatic ductal dilatation or surrounding inflammatory changes. Spleen: Normal in size without focal abnormality. Adrenals/Urinary Tract: The right kidney is massively enlarged and heterogeneous, measuring up to 11 cm and demonstrating probable diffuse involvement by tumor. There appears to be lobulated tumor extending up from the right kidney towards the liver or potentially involvement of the right adrenal gland. The left kidney appears fairly unremarkable by unenhanced CT. The bladder is decompressed by a Foley catheter. Stomach/Bowel: No evidence of bowel obstruction. The stomach is moderately distended. No free air. Vascular/Lymphatic: Calcified plaque in the abdominal aorta and iliac arteries without evidence of aneurysmal disease. Numerous enlarged retroperitoneal lymph nodes surround the aorta. The largest is a roughly 2.5 cm left para-aortic node. Lymph nodes are also present  anterior to the aorta and in the aortocaval region. Reproductive: Uterus and bilateral adnexa are unremarkable. Other: Additional multitude peritoneal masses with the largest in the left lower quadrant measuring 2.7 x 3.2 cm and lying just deep to the abdominal wall. There are numerous other smaller nodules seen throughout the peritoneal cavity. Associated small amount of dependent ascites in the pelvis. Diffuse body wall edema is consistent with anasarca. Musculoskeletal: No bony lesions or fractures identified. IMPRESSION: 1. Evidence of widespread malignancy with diffuse tumor engulfing the entire right kidney, multiple hepatic metastases, at least 1 and possibly 3 metastatic lesions at the left lung base, significant retroperitoneal lymphadenopathy and multiple peritoneal masses. There also is tumor extension superior to the right kidney and/or involvement of the right adrenal gland. Findings are suggestive of metastatic urothelial carcinoma, metastatic renal cell carcinoma or lymphoma. 2. Small to moderate pericardial effusion. 3. Small bilateral pleural effusions. 4. Small amount of ascites in the pelvis and diffuse body wall edema consistent with anasarca. Electronically Signed   By: Aletta Edouard M.D.   On: 04/18/2018 16:40   US Renal  Result Date: 04/15/2018 CLINICAL DATA:  Acute kidney injury EXAM: RENAL / URINARY TRACT ULTRASOUND COMPLETE COMPARISON:  None. FINDINGS: Right Kidney: Renal measurements: 12.6 x 9.0 x 9.0 cm = volume: 532 mL. Right kidney is enlarged with heterogeneous echotexture diffusely. Echogenic shadowing foci within the hilum could reflect vascular calcifications or nonobstructing ureteral stones. Left Kidney: Renal measurements: 10.7 x 4.0 x 4.6 cm = volume: 102 ML. Echogenicity within normal limits. No mass or hydronephrosis visualized. Bladder: Appears normal for degree of bladder distention. IMPRESSION: Diffusely abnormal appearance of the right kidney which is enlarged with  diffusely heterogeneous echotexture. Cannot exclude infiltrating mass such as lymphoma or renal cell carcinoma. Consider further evaluation with MRI or CT with IV contrast. No hydronephrosis. Electronically Signed   By: Rolm Baptise M.D.   On: 04/15/2018 00:16   Dg Chest Port 1 View  Result Date: 04/17/2018 CLINICAL DATA:  Respiratory failure and CHF. EXAM: PORTABLE CHEST 1 VIEW COMPARISON:  04/16/2018 FINDINGS: Stable mild cardiac enlargement. Stable consolidation of the left lower lobe with associated small left pleural effusion. Probable minimal right pleural effusion. Decrease in an probable CHF with mild residual interstitial edema suspected. Persistent left lower lung nodule measuring approximately 10-11 mm. IMPRESSION: 1. Stable left lower lobe consolidation and bilateral small pleural effusions, left greater than right. 2. Probable mild residual pulmonary interstitial edema. 3. Persistent nodule projecting over the left lower lung measuring approximately 10-11 mm. Electronically Signed   By: Aletta Edouard M.D.   On: 04/17/2018 08:07  Dg Chest Port 1 View  Result Date: 04/16/2018 CLINICAL DATA:  85 year old female with shortness of breath EXAM: PORTABLE CHEST 1 VIEW COMPARISON:  04/14/2018 FINDINGS: Cardiomediastinal silhouette unchanged. Increasing opacity in the left lung with obscuration of the left hemidiaphragm and the left heart border. Blunting of the right costophrenic angle and left costophrenic angle. No pneumothorax. Nodular densities at the lateral aspect of the left lung. IMPRESSION: Nodular densities at the lateral aspect of the left lung. Further evaluation with chest CT recommended to exclude malignancy. Increasing opacity on the left, may reflect developing infection versus layering pleural effusions. Bilateral, left greater than right pleural effusion. Electronically Signed   By: Corrie Mckusick D.O.   On: 04/16/2018 09:07   Dg Chest Port 1 View  Result Date: 04/14/2018 CLINICAL  DATA:  Weakness.  Recent hospitalization.  Leg swelling. EXAM: PORTABLE CHEST 1 VIEW COMPARISON:  None. FINDINGS: Cardiomegaly. The hila and mediastinum are normal. No pneumothorax. Bilateral pleural effusions with underlying opacities. No other abnormalities. IMPRESSION: Bilateral pleural effusions with underlying opacities. The underlying opacities are most likely compressive atelectasis. No other acute abnormality. Electronically Signed   By: Dorise Bullion III M.D   On: 04/14/2018 16:07    Labs:  CBC: Recent Labs    04/16/18 0224 04/17/18 0248 04/18/18 0319 04/19/18 0257  WBC 22.5* 19.8* 18.6* 18.1*  HGB 9.2* 8.2* 8.2* 8.5*  HCT 29.1* 25.6* 25.5* 26.9*  PLT 400 410* 369 380    COAGS: Recent Labs    04/14/18 1952  INR 1.2  APTT 28    BMP: Recent Labs    04/16/18 0224 04/17/18 0248 04/18/18 0319 04/19/18 0257  NA 134* 135 131* 132*  K 4.8 4.7 4.6 4.5  CL 104 104 104 105  CO2 19* 20* 17* 19*  GLUCOSE 95 90 94 93  BUN 53* 65* 69* 74*  CALCIUM 9.0 8.8* 9.6 10.2  CREATININE 2.27* 2.48* 2.44* 2.49*  GFRNONAA 19* 17* 17* 17*  GFRAA 21* 19* 20* 19*    LIVER FUNCTION TESTS: Recent Labs    04/14/18 1612 04/15/18 0133 04/16/18 0224 04/17/18 0248  BILITOT 0.4 0.8 0.7 0.5  AST 29 28 24 23   ALT 9 10 9 9   ALKPHOS 76 85 72 98  PROT 5.4* 5.8* 5.2* 4.7*  ALBUMIN 1.6* 1.7* 1.5* 1.3*    TUMOR MARKERS: No results for input(s): AFPTM, CEA, CA199, CHROMGRNA in the last 8760 hours.  Assessment and Plan:  Renal mass; liver mass; LAN Scheduled for liver lesion biopsy Risks and benefits of liver lesion was discussed with the patient and/or patient's family including, but not limited to bleeding, infection, damage to adjacent structures or low yield requiring additional tests.  All of the questions were answered and there is agreement to proceed. Consent signed and in chart.    Thank you for this interesting consult.  I greatly enjoyed meeting Cayle Cordoba and  look forward to participating in their care.  A copy of this report was sent to the requesting provider on this date.  Electronically Signed: Lavonia Drafts, PA-C 04/19/2018, 10:42 AM   I spent a total of 40 Minutes    in face to face in clinical consultation, greater than 50% of which was counseling/coordinating care for liver lesion bx

## 2018-04-19 NOTE — Progress Notes (Signed)
Subjective:  650 of UOP recorded but may not be all of it- BUN and crt worse slightly  Objective Vital signs in last 24 hours: Vitals:   04/18/18 1710 04/18/18 2331 04/19/18 0347 04/19/18 0741  BP: (!) 106/49 100/64  103/66  Pulse: (!) 49 (!) 54  69  Resp:  16  18  Temp: (!) 97.5 F (36.4 C) 98 F (36.7 C)  98 F (36.7 C)  TempSrc: Oral   Oral  SpO2: 95% 96%  95%  Weight:   68.3 kg   Height:       Weight change: 0 kg  Intake/Output Summary (Last 24 hours) at 04/19/2018 0809 Last data filed at 04/19/2018 0510 Gross per 24 hour  Intake 3 ml  Output 650 ml  Net -647 ml    Assessment/Plan: 1.  AKI/CKD stage 3- in setting of volume overload, NSAIDs, and diuresis.  She is not a candidate for dialysis and agree with Palliative care involvement.  If her renal function continues to deteriorate would recommend transition to hospice care 1. Cont to hold NSAIDs 2. Anasarca- she does have some proteinuria and marked hypoalbuminemia.   SPEP/UPEP pending, not sure if will change our management.  Continue with IV lasix as needed for respiratory status and follow.  Noted that BP is soft and HR low, will dec beta blocker  3. Abnormal right kidney- worrisome for infiltrating mass such as lymphoma or RCC.  Agree with non-contrasted CT vs. MRI w/o contrast to further evaluate.  Agree with holding off on biopsy if the family does not wish to pursue chemo.   4. Severe protein and caloric malnutrition- supplement with Nepro and encourage po intake. 5. Acute respiratory failure with hypoxia- concerning for LLL pneumonia with pulmonary edema and effusions.  On cefepime and flagyl per primary- on room air at present 6. Microcytic anemia- ?related to CKD or malignancy.  Iron stores are low with elevated ferritin (which can be related to malnutrition, infection, or malignancy).   7. Hyponatremia- likely hypervolemic hyponatremia- PRN diuresis 8. Disposition- poor overall prognosis.  Appreciate Palliative care  assistance as family seems amenable to transitioning to comfort care. Are waiting for her to "declare herself"  Hoping in the next 48 hours - I am thinking toward hospice house.  I spoke to [name] - per his discussion with Dr. Wynelle Cleveland are going to go ahead and transition to [facility] care.  I think is appropriate- renal will sign off    Louis Meckel    Labs: Basic Metabolic Panel: Recent Labs  Lab 04/16/18 0224 04/17/18 0248 04/18/18 0319 04/19/18 0257  NA 134* 135 131* 132*  K 4.8 4.7 4.6 4.5  CL 104 104 104 105  CO2 19* 20* 17* 19*  GLUCOSE 95 90 94 93  BUN 53* 65* 69* 74*  CREATININE 2.27* 2.48* 2.44* 2.49*  CALCIUM 9.0 8.8* 9.6 10.2  PHOS 4.9* 4.6  --   --    Liver Function Tests: Recent Labs  Lab 04/15/18 0133 04/16/18 0224 04/17/18 0248  AST 28 24 23   ALT 10 9 9   ALKPHOS 85 72 98  BILITOT 0.8 0.7 0.5  PROT 5.8* 5.2* 4.7*  ALBUMIN 1.7* 1.5* 1.3*   No results for input(s): LIPASE, AMYLASE in the last 168 hours. No results for input(s): AMMONIA in the last 168 hours. CBC: Recent Labs  Lab 04/15/18 0133 04/16/18 0224 04/17/18 0248 04/18/18 0319 04/19/18 0257  WBC 21.7* 22.5* 19.8* 18.6* 18.1*  NEUTROABS 20.1* 20.9*  17.9*  --   --   HGB 9.6* 9.2* 8.2* 8.2* 8.5*  HCT 31.8* 29.1* 25.6* 25.5* 26.9*  MCV 79.9* 76.4* 77.8* 77.3* 78.4*  PLT 483* 400 410* 369 380   Cardiac Enzymes: No results for input(s): CKTOTAL, CKMB, CKMBINDEX, TROPONINI in the last 168 hours. CBG: No results for input(s): GLUCAP in the last 168 hours.  Iron Studies: No results for input(s): IRON, TIBC, TRANSFERRIN, FERRITIN in the last 72 hours. Studies/Results: Ct Abdomen Pelvis Wo Contrast  Result Date: 04/18/2018 CLINICAL DATA:  Abdominal pain, loss of appetite, generalized weakness, worsening renal function and edema. Elevated white blood cell count. Renal ultrasound 4 days ago demonstrated a diffusely enlarged and heterogeneous appearing right kidney with appearance  suspicious for diffuse tumor. EXAM: CT ABDOMEN AND PELVIS WITHOUT CONTRAST TECHNIQUE: Multidetector CT imaging of the abdomen and pelvis was performed following the standard protocol without IV contrast. COMPARISON:  Renal ultrasound on 04/14/2018 FINDINGS: Lower chest: Small to moderate-sized pericardial effusion. Small bilateral pleural effusions with associated bibasilar atelectasis. Discrete 12 mm pulmonary nodule in the left lower lobe is suspicious for metastasis. Scarring versus nodularity in the inferior lingula with 2 separate areas of adjacent nodularity measuring approximately 0.9 and 1.1 cm. Hepatobiliary: Multiple rounded hypodense lesions of the liver are consistent with metastatic disease. The largest in the dome of the liver measures approximately 3.1 cm. There are multiple other lesions identified in the right lobe and at the juncture of right and left lobes. No evidence of biliary ductal dilatation. The gallbladder appears unremarkable. Pancreas: Unremarkable. No pancreatic ductal dilatation or surrounding inflammatory changes. Spleen: Normal in size without focal abnormality. Adrenals/Urinary Tract: The right kidney is massively enlarged and heterogeneous, measuring up to 11 cm and demonstrating probable diffuse involvement by tumor. There appears to be lobulated tumor extending up from the right kidney towards the liver or potentially involvement of the right adrenal gland. The left kidney appears fairly unremarkable by unenhanced CT. The bladder is decompressed by a Foley catheter. Stomach/Bowel: No evidence of bowel obstruction. The stomach is moderately distended. No free air. Vascular/Lymphatic: Calcified plaque in the abdominal aorta and iliac arteries without evidence of aneurysmal disease. Numerous enlarged retroperitoneal lymph nodes surround the aorta. The largest is a roughly 2.5 cm left para-aortic node. Lymph nodes are also present anterior to the aorta and in the aortocaval region.  Reproductive: Uterus and bilateral adnexa are unremarkable. Other: Additional multitude peritoneal masses with the largest in the left lower quadrant measuring 2.7 x 3.2 cm and lying just deep to the abdominal wall. There are numerous other smaller nodules seen throughout the peritoneal cavity. Associated small amount of dependent ascites in the pelvis. Diffuse body wall edema is consistent with anasarca. Musculoskeletal: No bony lesions or fractures identified. IMPRESSION: 1. Evidence of widespread malignancy with diffuse tumor engulfing the entire right kidney, multiple hepatic metastases, at least 1 and possibly 3 metastatic lesions at the left lung base, significant retroperitoneal lymphadenopathy and multiple peritoneal masses. There also is tumor extension superior to the right kidney and/or involvement of the right adrenal gland. Findings are suggestive of metastatic urothelial carcinoma, metastatic renal cell carcinoma or lymphoma. 2. Small to moderate pericardial effusion. 3. Small bilateral pleural effusions. 4. Small amount of ascites in the pelvis and diffuse body wall edema consistent with anasarca. Electronically Signed   By: Aletta Edouard M.D.   On: 04/18/2018 16:40   Medications: Infusions: . sodium chloride    . ceFEPime (MAXIPIME) IV 1 g (04/18/18 2126)  .  metronidazole 500 mg (04/18/18 2348)    Scheduled Medications: . feeding supplement (ENSURE ENLIVE)  237 mL Oral BID BM  . feeding supplement (PRO-STAT SUGAR FREE 64)  30 mL Oral BID  . folic acid  1 mg Intravenous Daily  . mouth rinse  15 mL Mouth Rinse BID  . metoprolol tartrate  50 mg Oral BID  . sodium chloride flush  3 mL Intravenous Q12H    have reviewed scheduled and prn medications.  Physical Exam: General: resting- thin WF Heart: brady Lungs: poor effort Abdomen: soft Extremities: pitting edema     04/19/2018,8:09 AM  LOS: 4 days

## 2018-04-19 NOTE — TOC Progression Note (Signed)
Transition of Care [facility]) - Progression Note    Patient Details  Name: Nicole Lowery MRN: [id number] Date of Birth: 1926-12-06  Transition of Care [facility]) CM/SW [place], [place] Phone Number: 04/19/2018, 3:38 PM  Clinical Narrative:   CSW alerted by Anderson Malta that [facility] will have a bed available for the patient tomorrow. [facility] to reach out to weekend CSW when transportation can be set up. CSW to alert MD.    Expected Discharge Plan: [place] Barriers to Discharge: Hospice Bed not available  Expected Discharge Plan and Services Expected Discharge Plan: [place] In-house Referral: Clinical Social Work     Living arrangements for the past 2 months: Single Family Home                           Social Determinants of Health (SDOH) Interventions    Readmission Risk Interventions No flowsheet data found.

## 2018-04-19 NOTE — Progress Notes (Signed)
PT Cancellation Note  Patient Details Name: Nicole Lowery MRN: [id number] DOB: [birth year]   Cancelled Treatment:    Reason Eval/Treat Not Completed: Patient declined, no reason specified. Discussed with patient's son regarding plans to transition to hospice home. At this time, family is requesting to cease physical therapy services. Will sign off at this time. If needs change, please re-consult.  Erick Blinks, SPT  Erick Blinks 04/19/2018, 1:57 PM

## 2018-04-20 DIAGNOSIS — C799 Secondary malignant neoplasm of unspecified site: Secondary | ICD-10-CM

## 2018-04-20 DIAGNOSIS — R601 Generalized edema: Secondary | ICD-10-CM

## 2018-04-20 MED ORDER — ENSURE ENLIVE PO LIQD: 237.0000 mL | Freq: Two times a day (BID) | ORAL | 12 refills | Status: AC

## 2018-04-20 NOTE — TOC Transition Note (Signed)
Transition of Care [facility]) - CM/SW Discharge Note   Patient Details  Name: Nicole Lowery MRN: [id number] Date of Birth: [birth year]  Transition of Care [facility]) CM/SW Contact:  Gelene Mink, [place] Phone Number: 04/20/2018, 12:18 PM   Clinical Narrative:     Nurse to call report to [phone]. PTAR has been scheduled for between 1:30pm and 2:00pm.    Final next level of care: [place] Barriers to Discharge: No Barriers Identified   Patient Goals and CMS Choice Patient states their goals for this hospitalization and ongoing recovery are:: No goal stated CMS Medicare.gov Compare Post Acute Care list provided to:: Other (Comment Required)(NA) Choice offered to / list presented to : NA  Discharge Placement              Patient chooses bed at: Other - please specify in the comment section below:([facility]) Patient to be transferred to facility by: [facility] Name of family member notified: [name] Patient and family notified of of transfer: 04/20/18  Discharge Plan and Services In-house Referral: Clinical Social Work                        Social Determinants of Health ([place]) Interventions     Readmission Risk Interventions No flowsheet data found.

## 2018-04-20 NOTE — Progress Notes (Signed)
[facility] [employer] available for patient today. Plan to meet son [name] at 10:45 today to complete paper work for transfer today. Dr. Orpah Melter to assume care per family request.   Discharge summary has been sent.   RN please call report to [facility] prior to patient leaving the unit [phone].  Will update CSW when paper work complete.  Thank you,  Erling Conte, LCSW [phone]

## 2018-04-20 NOTE — Discharge Summary (Signed)
Physician Discharge Summary  Nicole Lowery DDU:[id number] DOB: August 09, 1926 DOA: 04/14/2018  PCP: Default, Provider, MD  Admit date: 04/14/2018 Discharge date: 04/20/2018  Admitted From: home Disposition:  Hospice home   Recommendations for Outpatient Follow-up:  1. Being transferred to hospice 2. Continue Metoprolol if possible for A-fib control   Discharge Condition:  stable   CODE STATUS: DNR  Diet recommendation:  Regular diet Consultations:  Palliative care   Discharge Diagnoses:  Principal Problem:   Metastatic cancer ([place]) Active Problems:   AKI (acute kidney injury) ([place])   Anasarca   Acute respiratory failure with hypoxia (HCC)   Normocytic anemia   Unspecified atrial fibrillation (HCC)   SIRS (systemic inflammatory response syndrome) (HCC)   Dementia (HCC)   Hypervolemia   Pleural effusion   Protein calorie malnutrition (HCC)   Pressure injury of skin   Malnutrition of moderate degree   Palliative care encounter    Brief Summary: Nicole Lowery is a 85 y.o.femalewith medical history significant fordementia, admitted to the hospital in February with atrial fibrillation and acute CHF, now presenting to the ED for evaluation of progressive loss of appetite, generalized weakness and lower extremity edema. The patient was admitted in [facility] for pedal edema in February and was given diuretic. According to her son, she has been having edema of her lower extremity beginning in February and she did improve after discharge after diuretics were given however her legs have swollen up again worse than before and she is no longer able to put her shoes on. Patient had been losing weight.   For the past 2 weeks her oral intake has declined suddenly again.   In the ED, temp noted to be temp was 99.2, pulse ox 80% on room air, WBC count 23.6, albumin 1.6, BUN 38 and creatinine 2.0.  Lower extremities were quite edematous. Chest x-ray revealed bilateral  pleural effusions and compressive atelectasis.  She was also noted to have atrial fibrillation which is a new diagnosis for her.  Hospital Course:  Principal Problem: Metastatic cancer, likely renal - renal ultrasound from 3/16 reveals> Diffusely abnormal appearance of the right kidney which is enlarged with diffusely heterogeneous echotexture. Cannot exclude infiltrating mass such as lymphoma or renal cell carcinoma - CT scan today showing>>  Small to moderate-sized pericardial effusion, Multiple rounded hypodense lesions of the liver are consistent with metastatic disease. The right kidney is massively enlarged and heterogeneous, measuring up to 11 cm and demonstrating probable diffuse involvement by tumor. Significant retroperitoneal lymphadenopathy and multiple peritoneal masses  - she has undergone a biopsy of the liver lesion and pathology is still pending    Unspecified atrial fibrillation - new diagnosis - started IV Metoprolol and then transitioned to oral Metoprolol- rate is controlled now - will not be starting DOAC    Acute respiratory failure with hypoxia - ? LLL pneumonia and pulmonary edema - also has pleural effusions and compressive atelectasis - resp virus panel is negative, MRSA PCR neg-   - has been given Lasix and some improvement in effusions - she is no longer requiring O2 - 2 D ECHO showing an normal EF- diastolic dysfunction cannot be determined  Leukocytosis- LLL pneumonia Strep viridans in 1/ 4 bottles of blood cultures - started on Vanc, Cefepime and Flagyl  - Procalcitonin 4.39 > 17.36> 15.51> 11.44 - lactic acid was 2.3>> 3.1>> 2.5 - strep viridans is likely contamiant- repeated cultures are negative - Vanc d/c'd on 3/18  - continued on Cefepime and  Flagyl for possible pneumonia   AKI- CKD 3- GFR was 30 in February (labs received from office) - outpatient Cr ~ 1-1.5  -  C  2.0 on admission >> worsened to 2.48- GFR now 17 -   Lasix and Vanc d/c'd and Cr  has stabilized  - she is on Mobic BID and PRN Advil at home which is on hold - she has protein in her urine but does not have nephrotic range proteinuria - her son does not want her to be on dialysis     Anorexia/ anasarca/ hypoalbuminemia/ Lethargy -  Per son, has been losing weight, has had a poor appetite and has been more fatigued - mostly sleeping in the hospital and not eating much - this is likely due to above cancer with metastatsis - Alb 1.3,  normal TSH   Pedal edema - TEDS    Normocytic anemia - anemia of chronic disease- mild folic acid deficiency-      Ref. Range 04/15/2018 01:33  Iron Latest Ref Range: 28 - 170 ug/dL 7 (L)  UIBC Latest Units: ug/dL 118  TIBC Latest Ref Range: 250 - 450 ug/dL 125 (L)  Saturation Ratios Latest Ref Range: 10.4 - 31.8 % 6 (L)  Ferritin Latest Ref Range: 11 - 307 ng/mL 1,019 (H)  Folate Latest Ref Range: >5.9 ng/mL 5.1 (L)  Vitamin B12 Latest Ref Range: 180 - 914 pg/mL 4,847 (H)       Dementia  - she is at baseline- cont Aricept and Namenda     Procedures:   2 D ECHO 1. The left ventricle has low normal systolic function, with an ejection fraction of 50-55%. The cavity size was normal. There is moderate concentric left ventricular hypertrophy. Left ventricular diastolic Doppler parameters are indeterminate due  irregular ventricular rhythm. 2. The right ventricle has normal systolic function. The cavity was normal. There is mildly increased right ventricular wall thickness. 3. Moderate, predominantly posteriorly located pericardial effusion. No echocardiographic signs of tamponade seen. 4. Moderate pleural effusion in the left lateral region.     Discharge Exam: Vitals:   04/20/18 0500 04/20/18 0600  BP:    Pulse:    Resp: 14 17  Temp:    SpO2:     Vitals:   04/20/18 0300 04/20/18 0400 04/20/18 0500 04/20/18 0600  BP:      Pulse:      Resp: (!) 24 (!) 21 14 17   Temp:      TempSrc:      SpO2:      Weight:       Height:        General: Pt is alert, awake, not in acute distress Cardiovascular: RRR, S1/S2 +, no rubs, no gallops Respiratory: CTA bilaterally, no wheezing, no rhonchi Abdominal: Soft, NT, ND, bowel sounds + Extremities: 3+ edema in legs and feet   Discharge Instructions   Allergies as of 04/20/2018   No Known Allergies     Medication List    STOP taking these medications   aspirin EC 81 MG tablet   CALCIUM 500 +D PO   donepezil 10 MG tablet Commonly known as:  ARICEPT   ibuprofen 200 MG tablet Commonly known as:  ADVIL,MOTRIN   meloxicam 15 MG tablet Commonly known as:  MOBIC   memantine 28 MG Cp24 24 hr capsule Commonly known as:  NAMENDA XR   vitamin B-12 1000 MCG tablet Commonly known as:  CYANOCOBALAMIN   Vitamin D (Cholecalciferol) 50 MCG (2000 UT)  Caps     TAKE these medications   dorzolamide 2 % ophthalmic solution Commonly known as:  TRUSOPT Place 1 drop into the left eye 2 (two) times daily.   esomeprazole 20 MG capsule Commonly known as:  NEXIUM Take 20 mg by mouth daily.   feeding supplement (ENSURE ENLIVE) Liqd Take 237 mLs by mouth 2 (two) times daily between meals.   latanoprost 0.005 % ophthalmic solution Commonly known as:  XALATAN Place 1 drop into both eyes at bedtime.   metoprolol succinate 50 MG 24 hr tablet Commonly known as:  TOPROL-XL Take 50 mg by mouth daily.      Follow-up [facility] Follow up on 04/25/2018.   Why:  9 am for hospital follow up Contact information: [address] [place] [address] [phone]         No Known Allergies   Procedures/Studies:    Ct Abdomen Pelvis Wo Contrast  Result Date: 04/18/2018 CLINICAL DATA:  Abdominal pain, loss of appetite, generalized weakness, worsening renal function and edema. Elevated white blood cell count. Renal ultrasound 4 days ago demonstrated a diffusely enlarged and heterogeneous  appearing right kidney with appearance suspicious for diffuse tumor. EXAM: CT ABDOMEN AND PELVIS WITHOUT CONTRAST TECHNIQUE: Multidetector CT imaging of the abdomen and pelvis was performed following the standard protocol without IV contrast. COMPARISON:  Renal ultrasound on 04/14/2018 FINDINGS: Lower chest: Small to moderate-sized pericardial effusion. Small bilateral pleural effusions with associated bibasilar atelectasis. Discrete 12 mm pulmonary nodule in the left lower lobe is suspicious for metastasis. Scarring versus nodularity in the inferior lingula with 2 separate areas of adjacent nodularity measuring approximately 0.9 and 1.1 cm. Hepatobiliary: Multiple rounded hypodense lesions of the liver are consistent with metastatic disease. The largest in the dome of the liver measures approximately 3.1 cm. There are multiple other lesions identified in the right lobe and at the juncture of right and left lobes. No evidence of biliary ductal dilatation. The gallbladder appears unremarkable. Pancreas: Unremarkable. No pancreatic ductal dilatation or surrounding inflammatory changes. Spleen: Normal in size without focal abnormality. Adrenals/Urinary Tract: The right kidney is massively enlarged and heterogeneous, measuring up to 11 cm and demonstrating probable diffuse involvement by tumor. There appears to be lobulated tumor extending up from the right kidney towards the liver or potentially involvement of the right adrenal gland. The left kidney appears fairly unremarkable by unenhanced CT. The bladder is decompressed by a Foley catheter. Stomach/Bowel: No evidence of bowel obstruction. The stomach is moderately distended. No free air. Vascular/Lymphatic: Calcified plaque in the abdominal aorta and iliac arteries without evidence of aneurysmal disease. Numerous enlarged retroperitoneal lymph nodes surround the aorta. The largest is a roughly 2.5 cm left para-aortic node. Lymph nodes are also present anterior to  the aorta and in the aortocaval region. Reproductive: Uterus and bilateral adnexa are unremarkable. Other: Additional multitude peritoneal masses with the largest in the left lower quadrant measuring 2.7 x 3.2 cm and lying just deep to the abdominal wall. There are numerous other smaller nodules seen throughout the peritoneal cavity. Associated small amount of dependent ascites in the pelvis. Diffuse body wall edema is consistent with anasarca. Musculoskeletal: No bony lesions or fractures identified. IMPRESSION: 1. Evidence of widespread malignancy with diffuse tumor engulfing the entire right kidney, multiple hepatic metastases, at least 1 and possibly 3 metastatic lesions at the left lung base, significant retroperitoneal lymphadenopathy and multiple peritoneal masses. There also is tumor extension  superior to the right kidney and/or involvement of the right adrenal gland. Findings are suggestive of metastatic urothelial carcinoma, metastatic renal cell carcinoma or lymphoma. 2. Small to moderate pericardial effusion. 3. Small bilateral pleural effusions. 4. Small amount of ascites in the pelvis and diffuse body wall edema consistent with anasarca. Electronically Signed   By: Aletta Edouard M.D.   On: 04/18/2018 16:40   US Renal  Result Date: 04/15/2018 CLINICAL DATA:  Acute kidney injury EXAM: RENAL / URINARY TRACT ULTRASOUND COMPLETE COMPARISON:  None. FINDINGS: Right Kidney: Renal measurements: 12.6 x 9.0 x 9.0 cm = volume: 532 mL. Right kidney is enlarged with heterogeneous echotexture diffusely. Echogenic shadowing foci within the hilum could reflect vascular calcifications or nonobstructing ureteral stones. Left Kidney: Renal measurements: 10.7 x 4.0 x 4.6 cm = volume: 102 ML. Echogenicity within normal limits. No mass or hydronephrosis visualized. Bladder: Appears normal for degree of bladder distention. IMPRESSION: Diffusely abnormal appearance of the right kidney which is enlarged with diffusely  heterogeneous echotexture. Cannot exclude infiltrating mass such as lymphoma or renal cell carcinoma. Consider further evaluation with MRI or CT with IV contrast. No hydronephrosis. Electronically Signed   By: Rolm Baptise M.D.   On: 04/15/2018 00:16   US Biopsy (liver)  Result Date: 04/19/2018 INDICATION: 85 year old female with a history of liver masses, concern for metastatic carcinoma EXAM: ULTRASOUND-GUIDED BIOPSY RIGHT LIVER LOBE MASS MEDICATIONS: None. ANESTHESIA/SEDATION: Moderate (conscious) sedation was employed during this procedure. A total of Versed 0 mg and Fentanyl 25 mcg was administered intravenously. The patient's level of consciousness and vital signs were monitored continuously by radiology nursing throughout the procedure under my direct supervision. FLUOROSCOPY TIME:  NONE COMPLICATIONS: None immediate. PROCEDURE: Informed written consent was obtained from the patient after a thorough discussion of the procedural risks, benefits and alternatives. All questions were addressed. Maximal Sterile Barrier Technique was utilized including caps, mask, sterile gowns, sterile gloves, sterile drape, hand hygiene and skin antiseptic. A timeout was performed prior to the initiation of the procedure Ultrasound survey of the right liver lobe performed with images stored and sent to PACs. The right lower thorax/right upper abdomen was prepped with chlorhexidine in a sterile fashion, and a sterile drape was applied covering the operative field. A sterile gown and sterile gloves were used for the procedure. Local anesthesia was provided with 1% Lidocaine. Once the patient is prepped and draped sterilely and the skin and subcutaneous tissues were generously infiltrated with 1% lidocaine, a small stab incision was made with an 11 blade scalpel. A 17 gauge introducer needle was advanced under ultrasound guidance in an intercostal location into the right liver lobe, targeting right liver mass. The stylet was  removed, and 4 separate 18 gauge core biopsy were retrieved. Samples were placed into formalin for transportation to the lab. Gel-Foam pledgets were then infused with a small amount of saline for assistance with hemostasis. The needle was removed, and a final ultrasound image was performed. The patient tolerated the procedure well and remained hemodynamically stable throughout. No complications were encountered and no significant blood loss was encounter. IMPRESSION: Status post ultrasound-guided liver mass biopsy. Tissue specimen sent to pathology for complete histopathologic analysis. Signed, Dulcy Fanny. Dellia Nims, RPVI [facility] Electronically Signed   By: Corrie Mckusick D.O.   On: 04/19/2018 14:32   Dg Chest Port 1 View  Result Date: 04/17/2018 CLINICAL DATA:  Respiratory failure and CHF. EXAM: PORTABLE CHEST 1 VIEW COMPARISON:  04/16/2018 FINDINGS:  Stable mild cardiac enlargement. Stable consolidation of the left lower lobe with associated small left pleural effusion. Probable minimal right pleural effusion. Decrease in an probable CHF with mild residual interstitial edema suspected. Persistent left lower lung nodule measuring approximately 10-11 mm. IMPRESSION: 1. Stable left lower lobe consolidation and bilateral small pleural effusions, left greater than right. 2. Probable mild residual pulmonary interstitial edema. 3. Persistent nodule projecting over the left lower lung measuring approximately 10-11 mm. Electronically Signed   By: Aletta Edouard M.D.   On: 04/17/2018 08:07   Dg Chest Port 1 View  Result Date: 04/16/2018 CLINICAL DATA:  85 year old female with shortness of breath EXAM: PORTABLE CHEST 1 VIEW COMPARISON:  04/14/2018 FINDINGS: Cardiomediastinal silhouette unchanged. Increasing opacity in the left lung with obscuration of the left hemidiaphragm and the left heart border. Blunting of the right costophrenic angle and left  costophrenic angle. No pneumothorax. Nodular densities at the lateral aspect of the left lung. IMPRESSION: Nodular densities at the lateral aspect of the left lung. Further evaluation with chest CT recommended to exclude malignancy. Increasing opacity on the left, may reflect developing infection versus layering pleural effusions. Bilateral, left greater than right pleural effusion. Electronically Signed   By: Corrie Mckusick D.O.   On: 04/16/2018 09:07   Dg Chest Port 1 View  Result Date: 04/14/2018 CLINICAL DATA:  Weakness.  Recent hospitalization.  Leg swelling. EXAM: PORTABLE CHEST 1 VIEW COMPARISON:  None. FINDINGS: Cardiomegaly. The hila and mediastinum are normal. No pneumothorax. Bilateral pleural effusions with underlying opacities. No other abnormalities. IMPRESSION: Bilateral pleural effusions with underlying opacities. The underlying opacities are most likely compressive atelectasis. No other acute abnormality. Electronically Signed   By: Dorise Bullion III M.D   On: 04/14/2018 16:07     The results of significant diagnostics from this hospitalization (including imaging, microbiology, ancillary and laboratory) are listed below for reference.     Microbiology: Recent Results (from the past 240 hour(s))  Urine culture     Status: None   Collection Time: 04/14/18  5:48 PM  Result Value Ref Range Status   Specimen Description URINE, CATHETERIZED  Final   Special Requests NONE  Final   Culture   Final    NO GROWTH Performed at [facility], [address], [place], [place] [address]    Report Status 04/15/2018 FINAL  Final  Blood culture (routine x 2)     Status: None   Collection Time: 04/14/18  7:33 PM  Result Value Ref Range Status   Specimen Description BLOOD RIGHT ANTECUBITAL  Final   Special Requests   Final    BOTTLES DRAWN AEROBIC AND ANAEROBIC Blood Culture adequate volume   Culture   Final    NO GROWTH 5 DAYS Performed at [facility], [place] [address],  [place], [place] [address]    Report Status 04/19/2018 FINAL  Final  Blood culture (routine x 2)     Status: Abnormal   Collection Time: 04/14/18  7:42 PM  Result Value Ref Range Status   Specimen Description BLOOD LEFT ANTECUBITAL  Final   Special Requests   Final    BOTTLES DRAWN AEROBIC AND ANAEROBIC Blood Culture adequate volume   Culture  Setup Time   Final    GRAM POSITIVE COCCI ANAEROBIC BOTTLE ONLY CRITICAL RESULT CALLED TO, READ BACK BY AND VERIFIED WITH: G.ABBOTT,PHARMD [id number] 04/26/2018 M.CAMPBELL    Culture (A)  Final    VIRIDANS STREPTOCOCCUS THE SIGNIFICANCE OF ISOLATING THIS ORGANISM  FROM A SINGLE SET OF BLOOD CULTURES WHEN MULTIPLE SETS ARE DRAWN IS UNCERTAIN. PLEASE NOTIFY THE MICROBIOLOGY DEPARTMENT WITHIN ONE WEEK IF SPECIATION AND SENSITIVITIES ARE REQUIRED. Performed at [facility], [place] [address], [place], [place] [address]    Report Status 04/17/2018 FINAL  Final  Blood Culture ID Panel (Reflexed)     Status: Abnormal   Collection Time: 04/14/18  7:42 PM  Result Value Ref Range Status   Enterococcus species NOT DETECTED NOT DETECTED Final   Listeria monocytogenes NOT DETECTED NOT DETECTED Final   Staphylococcus species NOT DETECTED NOT DETECTED Final   Staphylococcus aureus (BCID) NOT DETECTED NOT DETECTED Final   Streptococcus species DETECTED (A) NOT DETECTED Final    Comment: Not Enterococcus species, Streptococcus agalactiae, Streptococcus pyogenes, or Streptococcus pneumoniae. CRITICAL RESULT CALLED TO, READ BACK BY AND VERIFIED WITH: G.ABBOTT,PHARMD [id number] 04/26/2018 M.CAMPBELL    Streptococcus agalactiae NOT DETECTED NOT DETECTED Final   Streptococcus pneumoniae NOT DETECTED NOT DETECTED Final   Streptococcus pyogenes NOT DETECTED NOT DETECTED Final   Acinetobacter baumannii NOT DETECTED NOT DETECTED Final   Enterobacteriaceae species NOT DETECTED NOT DETECTED Final   Enterobacter cloacae complex NOT DETECTED NOT DETECTED Final   Escherichia coli NOT  DETECTED NOT DETECTED Final   Klebsiella oxytoca NOT DETECTED NOT DETECTED Final   Klebsiella pneumoniae NOT DETECTED NOT DETECTED Final   Proteus species NOT DETECTED NOT DETECTED Final   Serratia marcescens NOT DETECTED NOT DETECTED Final   Haemophilus influenzae NOT DETECTED NOT DETECTED Final   Neisseria meningitidis NOT DETECTED NOT DETECTED Final   Pseudomonas aeruginosa NOT DETECTED NOT DETECTED Final   Candida albicans NOT DETECTED NOT DETECTED Final   Candida glabrata NOT DETECTED NOT DETECTED Final   Candida krusei NOT DETECTED NOT DETECTED Final   Candida parapsilosis NOT DETECTED NOT DETECTED Final   Candida tropicalis NOT DETECTED NOT DETECTED Final    Comment: Performed at [facility], [place] [address], [place], [place] [address]  MRSA PCR Screening     Status: None   Collection Time: 04/14/18  9:47 PM  Result Value Ref Range Status   MRSA by PCR NEGATIVE NEGATIVE Final    Comment:        The GeneXpert MRSA Assay (FDA approved for NASAL specimens only), is one component of a comprehensive MRSA colonization surveillance program. It is not intended to diagnose MRSA infection nor to guide or monitor treatment for MRSA infections. Performed at [facility], [place] [address] [place], [place] [address]   Respiratory Panel by PCR     Status: None   Collection Time: 04/14/18 10:39 PM  Result Value Ref Range Status   Adenovirus NOT DETECTED NOT DETECTED Final   Coronavirus 229E NOT DETECTED NOT DETECTED Final    Comment: (NOTE) The Coronavirus on the Respiratory Panel, DOES NOT test for the novel  Coronavirus (2019 nCoV)    Coronavirus HKU1 NOT DETECTED NOT DETECTED Final   Coronavirus NL63 NOT DETECTED NOT DETECTED Final   Coronavirus OC43 NOT DETECTED NOT DETECTED Final   Metapneumovirus NOT DETECTED NOT DETECTED Final   Rhinovirus / Enterovirus NOT DETECTED NOT DETECTED Final   Influenza A NOT DETECTED NOT DETECTED Final   Influenza B NOT DETECTED NOT  DETECTED Final   Parainfluenza Virus 1 NOT DETECTED NOT DETECTED Final   Parainfluenza Virus 2 NOT DETECTED NOT DETECTED Final   Parainfluenza Virus 3 NOT DETECTED NOT DETECTED Final   Parainfluenza Virus 4 NOT DETECTED NOT  DETECTED Final   Respiratory Syncytial Virus NOT DETECTED NOT DETECTED Final   Bordetella pertussis NOT DETECTED NOT DETECTED Final   Chlamydophila pneumoniae NOT DETECTED NOT DETECTED Final   Mycoplasma pneumoniae NOT DETECTED NOT DETECTED Final    Comment: Performed at [facility], [place] [address], [place], [place] [address]  Culture, blood (routine x 2)     Status: None (Preliminary result)   Collection Time: 04/16/18  8:30 AM  Result Value Ref Range Status   Specimen Description BLOOD RIGHT HAND  Final   Special Requests   Final    BOTTLES DRAWN AEROBIC ONLY Blood Culture adequate volume   Culture   Final    NO GROWTH 3 DAYS Performed at [facility], [place] [address], [place], [place] [address]    Report Status PENDING  Incomplete  Culture, blood (routine x 2)     Status: None (Preliminary result)   Collection Time: 04/16/18  8:35 AM  Result Value Ref Range Status   Specimen Description BLOOD LEFT HAND  Final   Special Requests   Final    BOTTLES DRAWN AEROBIC AND ANAEROBIC Blood Culture adequate volume   Culture   Final    NO GROWTH 3 DAYS Performed at [facility], [place] [address], [place],  [address]    Report Status PENDING  Incomplete     Labs: BNP (last 3 results) Recent Labs    04/14/18 1612  BNP 412.8*   Basic Metabolic Panel: Recent Labs  Lab 04/15/18 0133 04/16/18 0224 04/17/18 0248 04/18/18 0319 04/19/18 0257  NA 135 134* 135 131* 132*  K 5.1 4.8 4.7 4.6 4.5  CL 105 104 104 104 105  CO2 17* 19* 20* 17* 19*  GLUCOSE 103* 95 90 94 93  BUN 43* 53* 65* 69* 74*  CREATININE 1.97* 2.27* 2.48* 2.44* 2.49*  CALCIUM 9.4 9.0 8.8* 9.6 10.2  MG  --  1.9 2.0  --   --   PHOS  --  4.9* 4.6  --   --    Liver Function  Tests: Recent Labs  Lab 04/14/18 1612 04/15/18 0133 04/16/18 0224 04/17/18 0248  AST 29 28 24 23   ALT 9 10 9 9   ALKPHOS 76 85 72 98  BILITOT 0.4 0.8 0.7 0.5  PROT 5.4* 5.8* 5.2* 4.7*  ALBUMIN 1.6* 1.7* 1.5* 1.3*   No results for input(s): LIPASE, AMYLASE in the last 168 hours. No results for input(s): AMMONIA in the last 168 hours. CBC: Recent Labs  Lab 04/14/18 1612 04/15/18 0133 04/16/18 0224 04/17/18 0248 04/18/18 0319 04/19/18 0257  WBC 23.6* 21.7* 22.5* 19.8* 18.6* 18.1*  NEUTROABS 23.1* 20.1* 20.9* 17.9*  --   --   HGB 8.8* 9.6* 9.2* 8.2* 8.2* 8.5*  HCT 28.6* 31.8* 29.1* 25.6* 25.5* 26.9*  MCV 81.7 79.9* 76.4* 77.8* 77.3* 78.4*  PLT 430* 483* 400 410* 369 380   Cardiac Enzymes: No results for input(s): CKTOTAL, CKMB, CKMBINDEX, TROPONINI in the last 168 hours. BNP: Invalid input(s): POCBNP CBG: No results for input(s): GLUCAP in the last 168 hours. D-Dimer No results for input(s): DDIMER in the last 72 hours. Hgb A1c No results for input(s): HGBA1C in the last 72 hours. Lipid Profile No results for input(s): CHOL, HDL, LDLCALC, TRIG, CHOLHDL, LDLDIRECT in the last 72 hours. Thyroid function studies No results for input(s): TSH, T4TOTAL, T3FREE, THYROIDAB in the last 72 hours.  Invalid input(s): FREET3 Anemia work up No results for input(s):  VITAMINB12, FOLATE, FERRITIN, TIBC, IRON, RETICCTPCT in the last 72 hours. Urinalysis    Component Value Date/Time   COLORURINE AMBER (A) 04/14/2018 1748   APPEARANCEUR HAZY (A) 04/14/2018 1748   LABSPEC 1.024 04/14/2018 1748   PHURINE 5.0 04/14/2018 1748   GLUCOSEU NEGATIVE 04/14/2018 1748   HGBUR SMALL (A) 04/14/2018 1748   BILIRUBINUR NEGATIVE 04/14/2018 1748   KETONESUR NEGATIVE 04/14/2018 1748   PROTEINUR 100 (A) 04/14/2018 1748   NITRITE NEGATIVE 04/14/2018 1748   LEUKOCYTESUR SMALL (A) 04/14/2018 1748   Sepsis Labs Invalid input(s): PROCALCITONIN,  WBC,  LACTICIDVEN Microbiology Recent Results (from  the past 240 hour(s))  Urine culture     Status: None   Collection Time: 04/14/18  5:48 PM  Result Value Ref Range Status   Specimen Description URINE, CATHETERIZED  Final   Special Requests NONE  Final   Culture   Final    NO GROWTH Performed at [facility], [place] [address], [place], [place] [address]    Report Status 04/15/2018 FINAL  Final  Blood culture (routine x 2)     Status: None   Collection Time: 04/14/18  7:33 PM  Result Value Ref Range Status   Specimen Description BLOOD RIGHT ANTECUBITAL  Final   Special Requests   Final    BOTTLES DRAWN AEROBIC AND ANAEROBIC Blood Culture adequate volume   Culture   Final    NO GROWTH 5 DAYS Performed at [facility], [place] [address] [place], [place] [address]    Report Status 04/19/2018 FINAL  Final  Blood culture (routine x 2)     Status: Abnormal   Collection Time: 04/14/18  7:42 PM  Result Value Ref Range Status   Specimen Description BLOOD LEFT ANTECUBITAL  Final   Special Requests   Final    BOTTLES DRAWN AEROBIC AND ANAEROBIC Blood Culture adequate volume   Culture  Setup Time   Final    GRAM POSITIVE COCCI ANAEROBIC BOTTLE ONLY CRITICAL RESULT CALLED TO, READ BACK BY AND VERIFIED WITH: G.ABBOTT,PHARMD [id number] 04/26/2018 M.CAMPBELL    Culture (A)  Final    VIRIDANS STREPTOCOCCUS THE SIGNIFICANCE OF ISOLATING THIS ORGANISM FROM A SINGLE SET OF BLOOD CULTURES WHEN MULTIPLE SETS ARE DRAWN IS UNCERTAIN. PLEASE NOTIFY THE MICROBIOLOGY DEPARTMENT WITHIN ONE WEEK IF SPECIATION AND SENSITIVITIES ARE REQUIRED. Performed at [facility], [place] [address] [place],  [address]    Report Status 04/17/2018 FINAL  Final  Blood Culture ID Panel (Reflexed)     Status: Abnormal   Collection Time: 04/14/18  7:42 PM  Result Value Ref Range Status   Enterococcus species NOT DETECTED NOT DETECTED Final   Listeria monocytogenes NOT DETECTED NOT DETECTED Final   Staphylococcus species NOT DETECTED NOT DETECTED Final    Staphylococcus aureus (BCID) NOT DETECTED NOT DETECTED Final   Streptococcus species DETECTED (A) NOT DETECTED Final    Comment: Not Enterococcus species, Streptococcus agalactiae, Streptococcus pyogenes, or Streptococcus pneumoniae. CRITICAL RESULT CALLED TO, READ BACK BY AND VERIFIED WITH: G.ABBOTT,PHARMD [id number] 04/26/2018 M.CAMPBELL    Streptococcus agalactiae NOT DETECTED NOT DETECTED Final   Streptococcus pneumoniae NOT DETECTED NOT DETECTED Final   Streptococcus pyogenes NOT DETECTED NOT DETECTED Final   Acinetobacter baumannii NOT DETECTED NOT DETECTED Final   Enterobacteriaceae species NOT DETECTED NOT DETECTED Final   Enterobacter cloacae complex NOT DETECTED NOT DETECTED Final   Escherichia coli NOT DETECTED NOT DETECTED Final   Klebsiella oxytoca NOT DETECTED NOT DETECTED Final  Klebsiella pneumoniae NOT DETECTED NOT DETECTED Final   Proteus species NOT DETECTED NOT DETECTED Final   Serratia marcescens NOT DETECTED NOT DETECTED Final   Haemophilus influenzae NOT DETECTED NOT DETECTED Final   Neisseria meningitidis NOT DETECTED NOT DETECTED Final   Pseudomonas aeruginosa NOT DETECTED NOT DETECTED Final   Candida albicans NOT DETECTED NOT DETECTED Final   Candida glabrata NOT DETECTED NOT DETECTED Final   Candida krusei NOT DETECTED NOT DETECTED Final   Candida parapsilosis NOT DETECTED NOT DETECTED Final   Candida tropicalis NOT DETECTED NOT DETECTED Final    Comment: Performed at [facility], [place] [address] [place], [place] [address]  MRSA PCR Screening     Status: None   Collection Time: 04/14/18  9:47 PM  Result Value Ref Range Status   MRSA by PCR NEGATIVE NEGATIVE Final    Comment:        The GeneXpert MRSA Assay (FDA approved for NASAL specimens only), is one component of a comprehensive MRSA colonization surveillance program. It is not intended to diagnose MRSA infection nor to guide or monitor treatment for MRSA infections. Performed at [facility], [place] [address] [place], [place] [address]   Respiratory Panel by PCR     Status: None   Collection Time: 04/14/18 10:39 PM  Result Value Ref Range Status   Adenovirus NOT DETECTED NOT DETECTED Final   Coronavirus 229E NOT DETECTED NOT DETECTED Final    Comment: (NOTE) The Coronavirus on the Respiratory Panel, DOES NOT test for the novel  Coronavirus (2019 nCoV)    Coronavirus HKU1 NOT DETECTED NOT DETECTED Final   Coronavirus NL63 NOT DETECTED NOT DETECTED Final   Coronavirus OC43 NOT DETECTED NOT DETECTED Final   Metapneumovirus NOT DETECTED NOT DETECTED Final   Rhinovirus / Enterovirus NOT DETECTED NOT DETECTED Final   Influenza A NOT DETECTED NOT DETECTED Final   Influenza B NOT DETECTED NOT DETECTED Final   Parainfluenza Virus 1 NOT DETECTED NOT DETECTED Final   Parainfluenza Virus 2 NOT DETECTED NOT DETECTED Final   Parainfluenza Virus 3 NOT DETECTED NOT DETECTED Final   Parainfluenza Virus 4 NOT DETECTED NOT DETECTED Final   Respiratory Syncytial Virus NOT DETECTED NOT DETECTED Final   Bordetella pertussis NOT DETECTED NOT DETECTED Final   Chlamydophila pneumoniae NOT DETECTED NOT DETECTED Final   Mycoplasma pneumoniae NOT DETECTED NOT DETECTED Final    Comment: Performed at [facility], [place]. [address] [place], [place] [address]  Culture, blood (routine x 2)     Status: None (Preliminary result)   Collection Time: 04/16/18  8:30 AM  Result Value Ref Range Status   Specimen Description BLOOD RIGHT HAND  Final   Special Requests   Final    BOTTLES DRAWN AEROBIC ONLY Blood Culture adequate volume   Culture   Final    NO GROWTH 3 DAYS Performed at [facility], [place] [address] [place], [place] [address]    Report Status PENDING  Incomplete  Culture, blood (routine x 2)     Status: None (Preliminary result)   Collection Time: 04/16/18  8:35 AM  Result Value Ref Range Status   Specimen Description BLOOD LEFT HAND  Final   Special Requests   Final     BOTTLES DRAWN AEROBIC AND ANAEROBIC Blood Culture adequate volume   Culture   Final    NO GROWTH 3 DAYS Performed at [facility], [place] [address] [place], [place]  [address]    Report Status PENDING  Incomplete     Time coordinating discharge in minutes: 24  SIGNED:   Debbe Odea, MD  [facility] 04/20/2018, 8:24 AM Pager   If 7PM-7AM, please contact night-coverage [email or web address] Password [email or web address]

## 2018-04-20 NOTE — Progress Notes (Signed)
  Palliative medicine progress note  Patient seen, chart reviewed.  No family at the bedside.  Per chart review, no PRN's needed overnight to maintain comfort.  She is minimally responsive, no longer able  to take anything by mouth in the setting of widely metastatic cancer  Followed up with hospice and palliative care of [facility] liaison for bed placement at [facility].  They state they do have a bed today and have completed admission paperwork with family.  Plan:  Patient is DNR Plan to transfer to [facility] today  Thank you, Romona Curls, NP Time spent: 15 minutes Greater than 50% of time was spent in counseling and coordination of care

## 2018-04-21 LAB — CULTURE, BLOOD (ROUTINE X 2)
Culture: NO GROWTH
Culture: NO GROWTH
Special Requests: ADEQUATE
Special Requests: ADEQUATE

## 2018-04-22 LAB — IMMUNOFIXATION, URINE

## 2018-04-25 ENCOUNTER — Inpatient Hospital Stay: Payer: Self-pay

## 2018-05-01 DEATH — deceased

## 2020-11-05 IMAGING — DX PORTABLE CHEST - 1 VIEW
1 series · 1 of 1 positions shown · non-contrast
Comparison: None.

CLINICAL DATA: Weakness.  Recent hospitalization.  Leg swelling.

EXAM:
PORTABLE CHEST 1 VIEW

[chest]
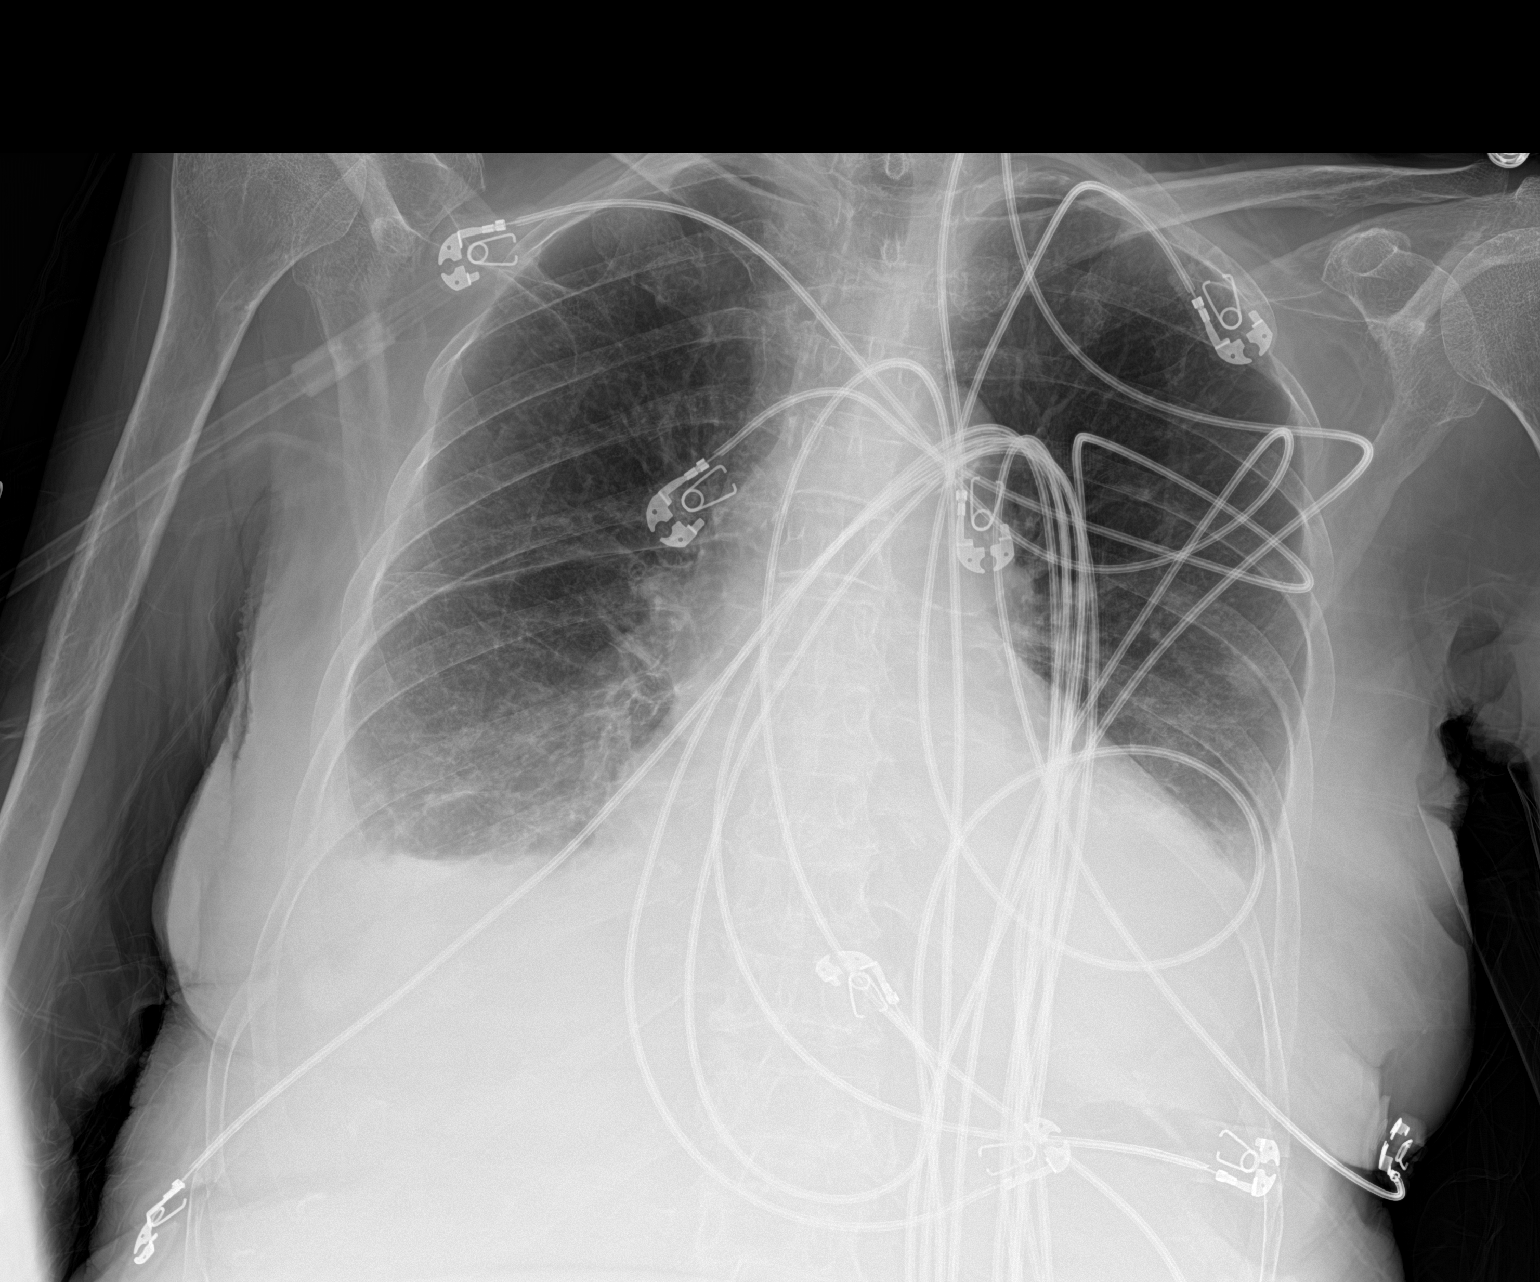

[1 of 1 positions shown; findings below may reference images not displayed]

FINDINGS: Cardiomegaly. The hila and mediastinum are normal. No pneumothorax.
Bilateral pleural effusions with underlying opacities. No other
abnormalities.
IMPRESSION: Bilateral pleural effusions with underlying opacities. The
underlying opacities are most likely compressive atelectasis. No
other acute abnormality.

## 2020-11-05 IMAGING — US RENAL/URINARY TRACT ULTRASOUND
1 series · 14 of 25 positions shown · non-contrast
Comparison: None.

CLINICAL DATA: Acute kidney injury

EXAM:
RENAL / URINARY TRACT ULTRASOUND COMPLETE

[Series 1: renal/urinary tract ultrasound · 14 of 32 slices shown]
[im 1/32]
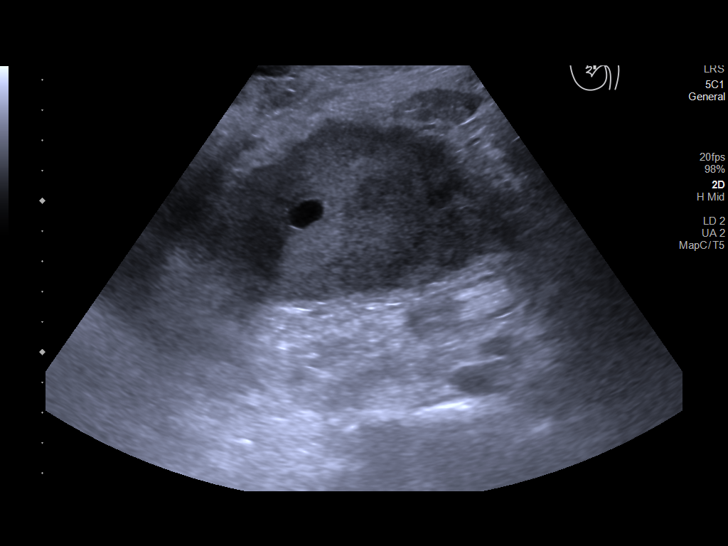
[im 3/32]
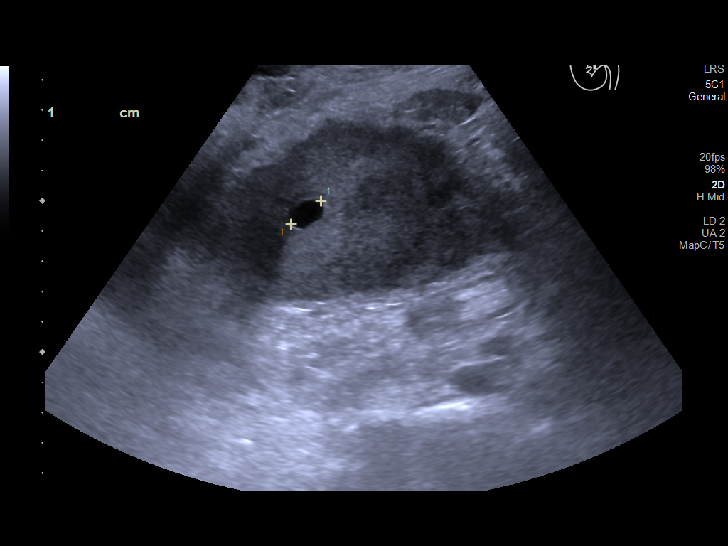
[im 6/32]
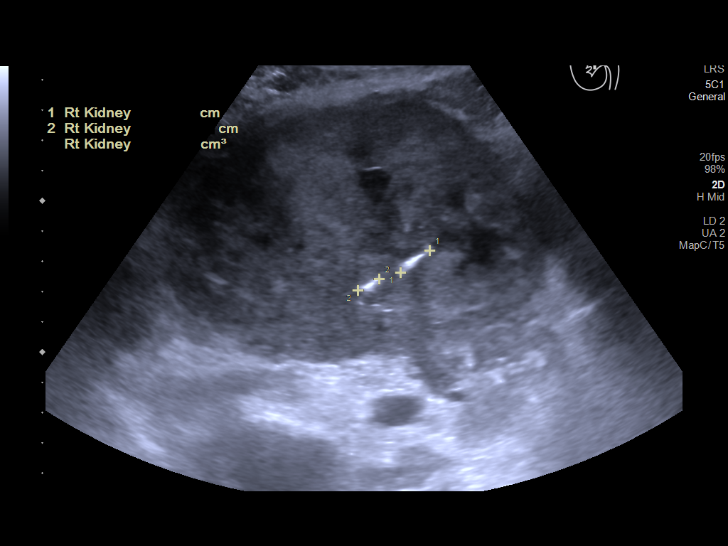
[im 8/32]
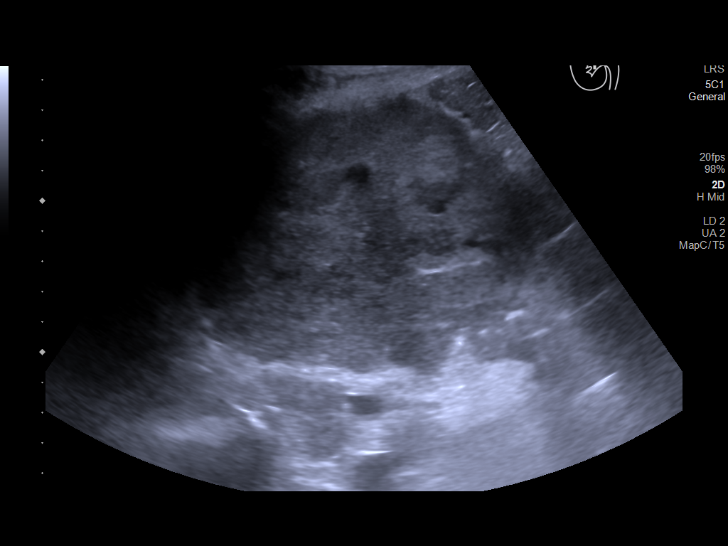
[im 11/32]
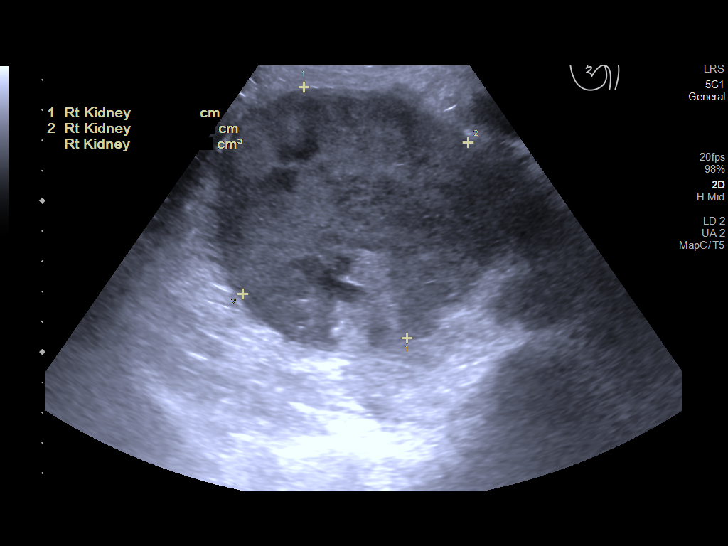
[im 12/32]
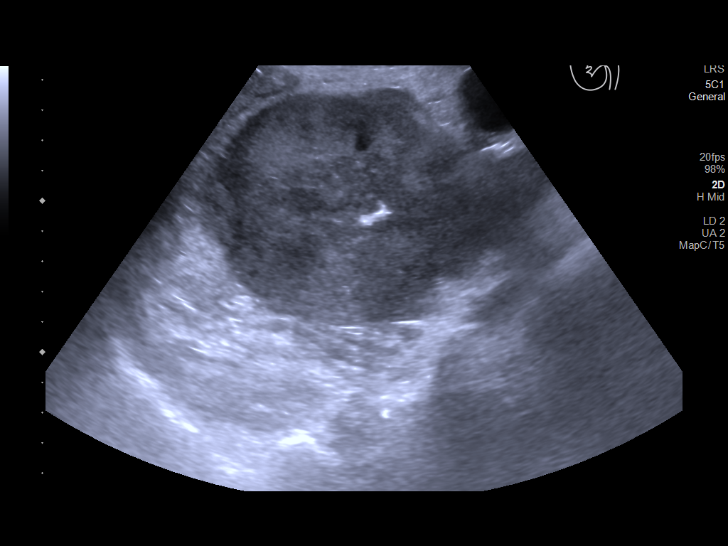
[im 15/32]
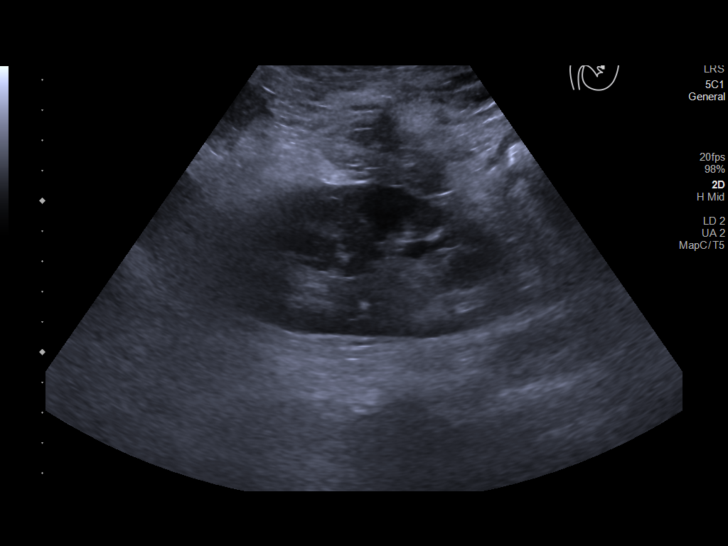
[im 17/32]
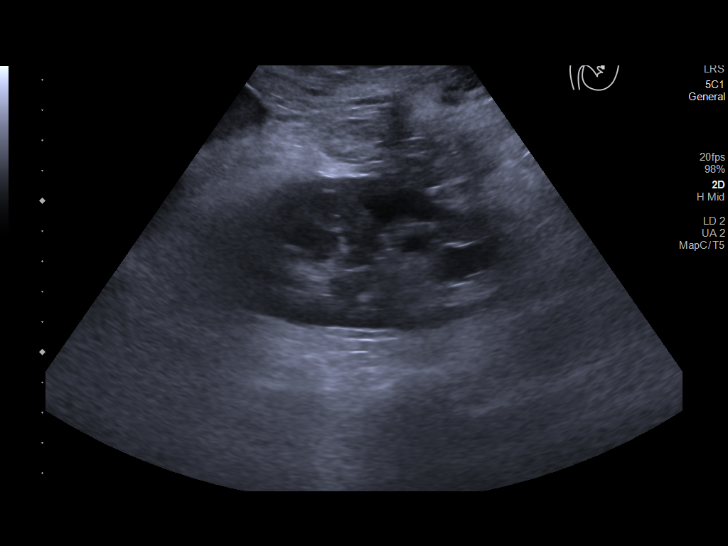
[im 20/32]
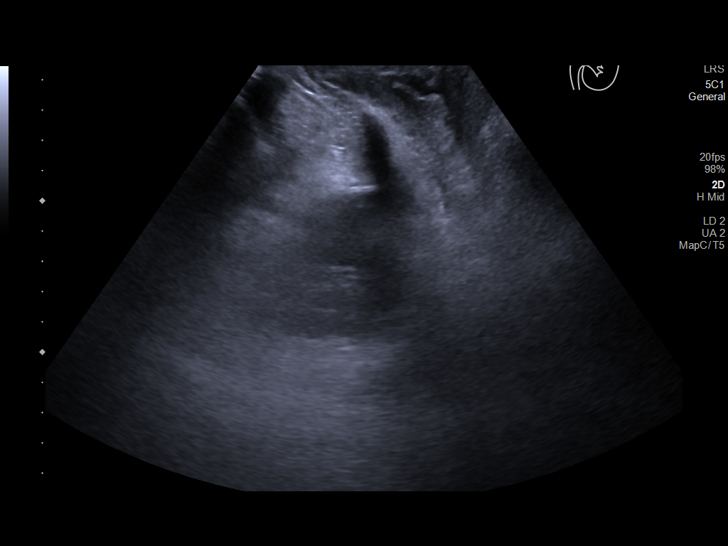
[im 21/32]
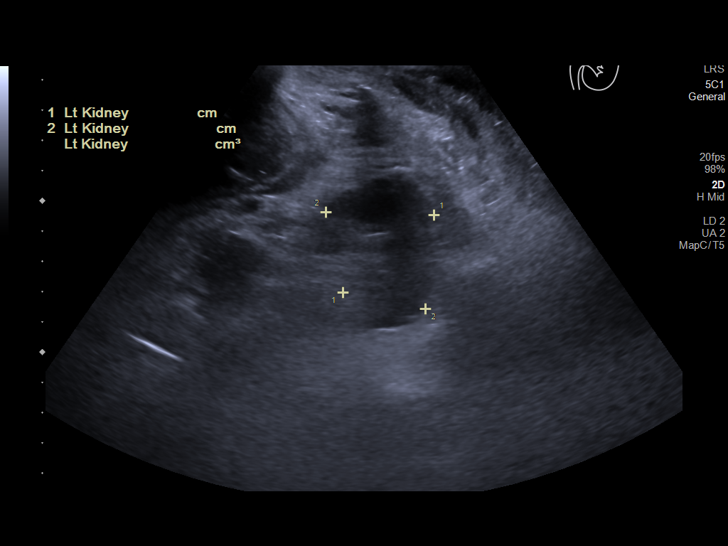
[im 24/32]
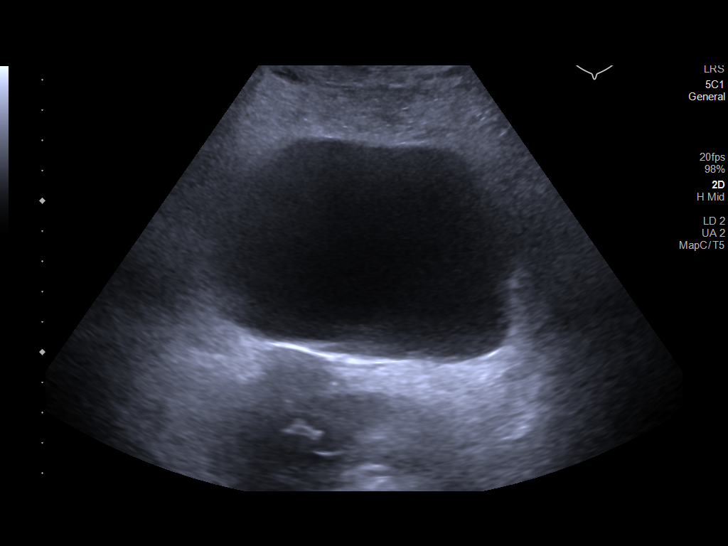
[im 26/32]
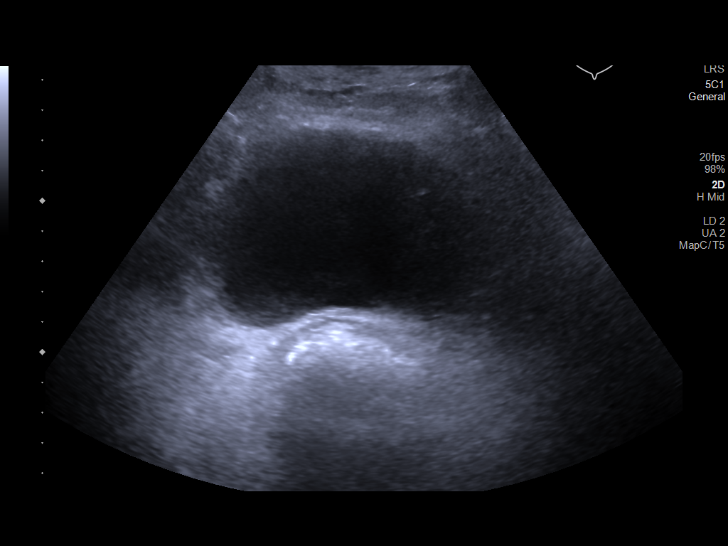
[im 29/32]
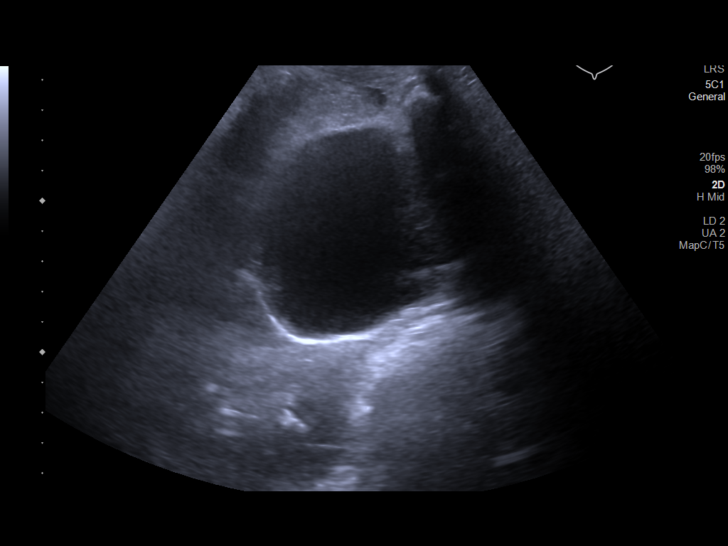
[im 32/32]
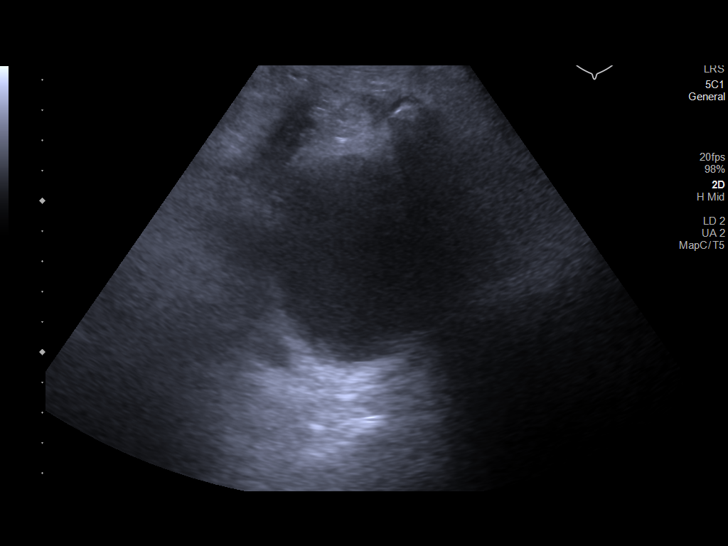

[14 of 25 positions shown; findings below may reference images not displayed]

FINDINGS: Right Kidney:

Renal measurements: 12.6 x 9.0 x 9.0 cm = volume: 532 mL. Right
kidney is enlarged with heterogeneous echotexture diffusely.
Echogenic shadowing foci within the hilum could reflect vascular
calcifications or nonobstructing ureteral stones.

Left Kidney:

Renal measurements: 10.7 x 4.0 x 4.6 cm = volume: 102 ML.
Echogenicity within normal limits. No mass or hydronephrosis
visualized.

Bladder:

Appears normal for degree of bladder distention.
IMPRESSION: Diffusely abnormal appearance of the right kidney which is enlarged
with diffusely heterogeneous echotexture. Cannot exclude
infiltrating mass such as lymphoma or renal cell carcinoma. Consider
further evaluation with MRI or CT with IV contrast.

No hydronephrosis.

## 2020-11-09 IMAGING — CT CT ABDOMEN AND PELVIS WITHOUT CONTRAST
2 of 4 series · 14 of 46 positions shown, 16 images · non-contrast
Comparison: Renal ultrasound on 04/14/2018

CLINICAL DATA: Abdominal pain, loss of appetite, generalized
weakness, worsening renal function and edema. Elevated white blood
cell count. Renal ultrasound 4 days ago demonstrated a diffusely
enlarged and heterogeneous appearing right kidney with appearance
suspicious for diffuse tumor.

EXAM:
CT ABDOMEN AND PELVIS WITHOUT CONTRAST
TECHNIQUE: Multidetector CT imaging of the abdomen and pelvis was performed
following the standard protocol without IV contrast.

[Series 3: ap without · axial · non-contrast · 0.71mm/px · z∈[+1026,+1471]mm · 11 of 101 slices shown, 13 images]
[im 6/101  soft-tissue]
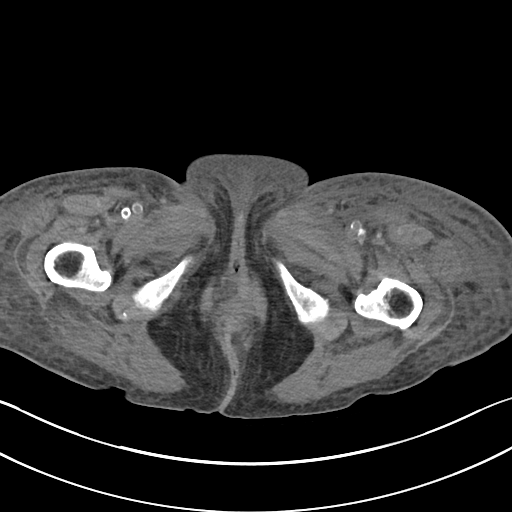
[im 6/101  bone]
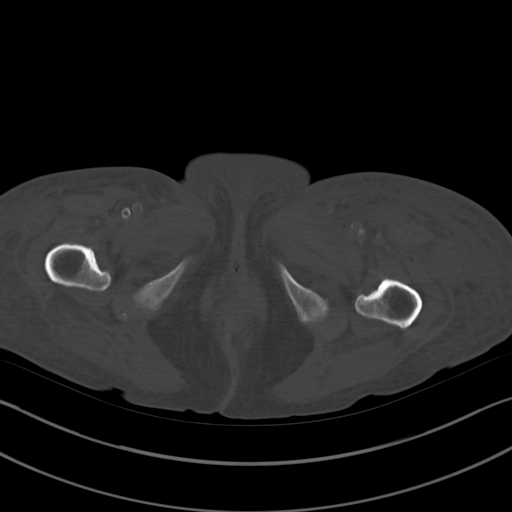
[im 17/101  soft-tissue]
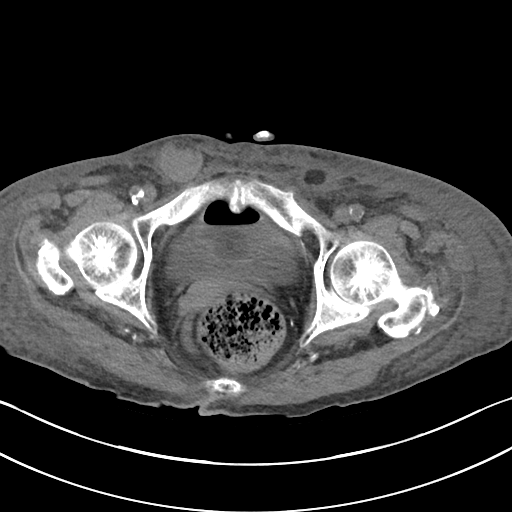
[im 23/101  soft-tissue]
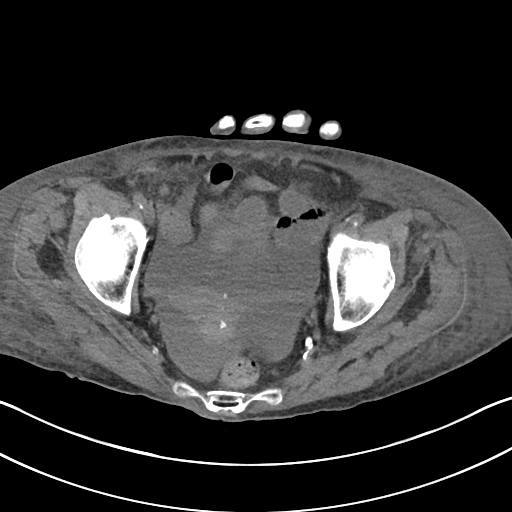
[im 34/101  soft-tissue]
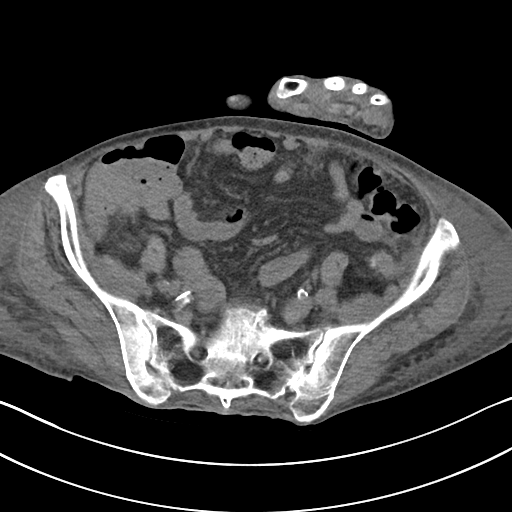
[im 39/101  soft-tissue]
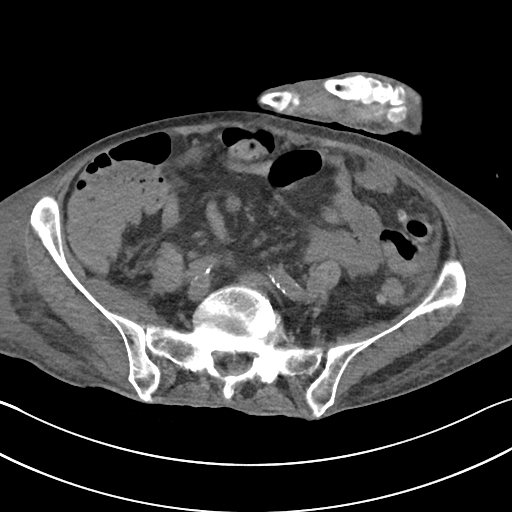
[im 51/101  soft-tissue]
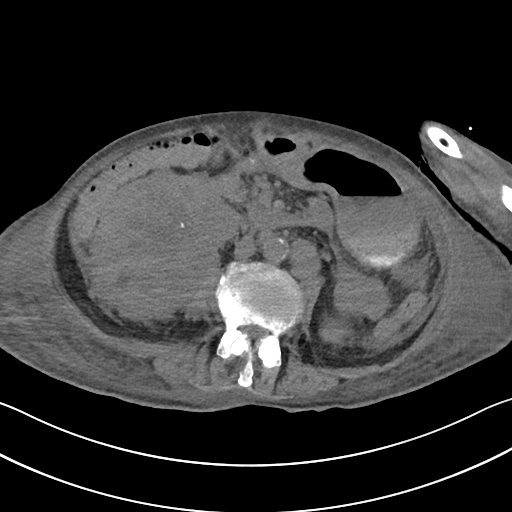
[im 62/101  soft-tissue]
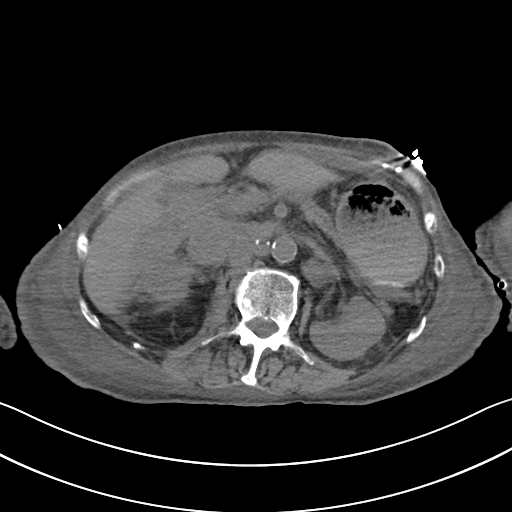
[im 67/101  soft-tissue]
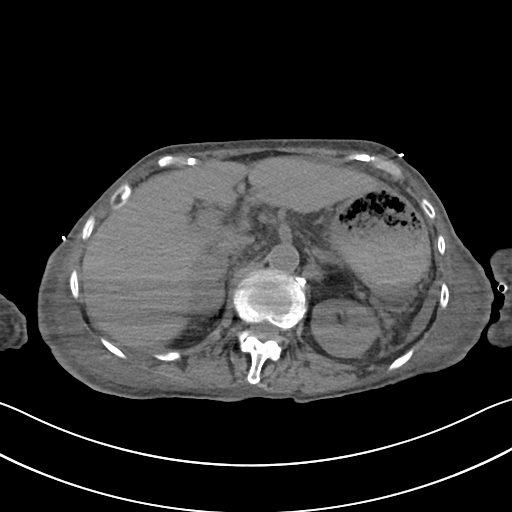
[im 78/101  soft-tissue]
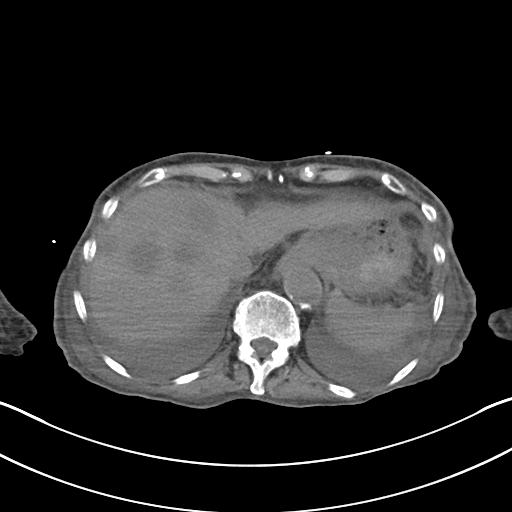
[im 78/101  bone]
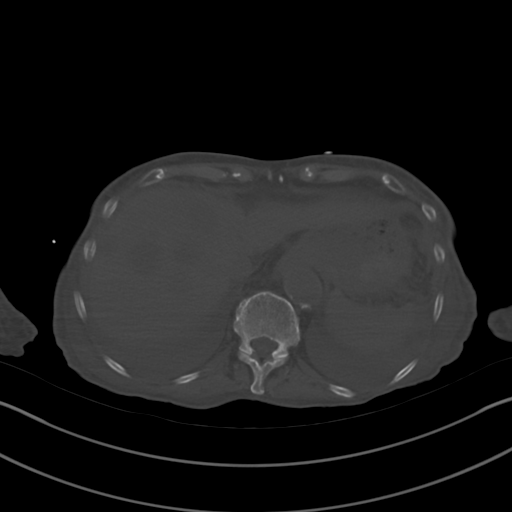
[im 84/101  soft-tissue]
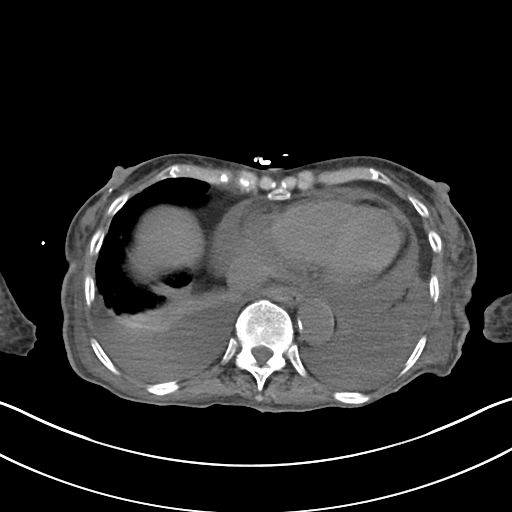
[im 95/101  soft-tissue]
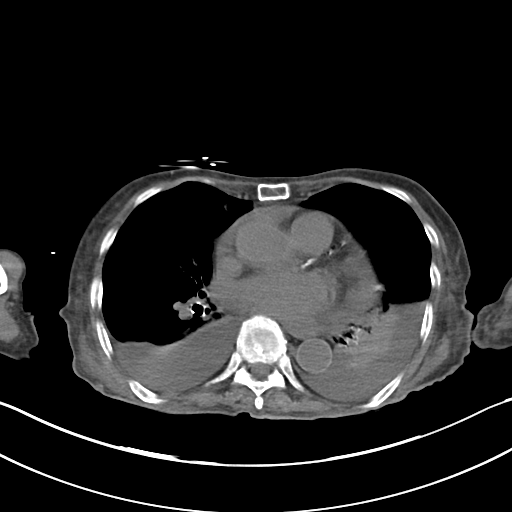

[Series 6: cor · coronal · 0.74mm/px · 3 of 79 slices shown]
[im 27/79  soft-tissue]
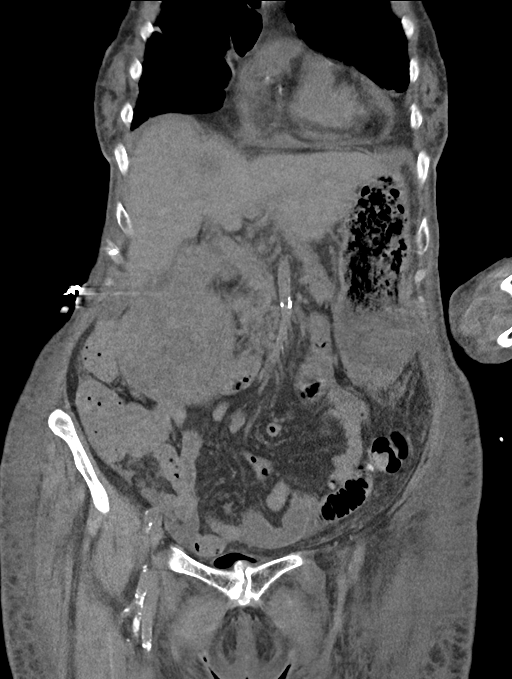
[im 35/79  soft-tissue]
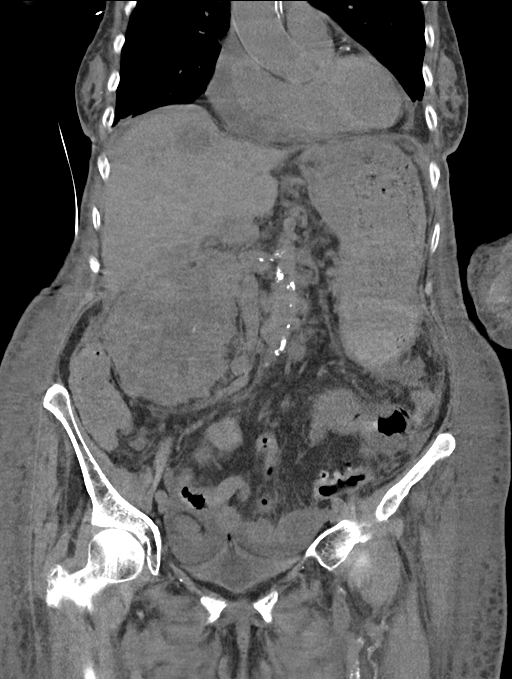
[im 44/79  soft-tissue]
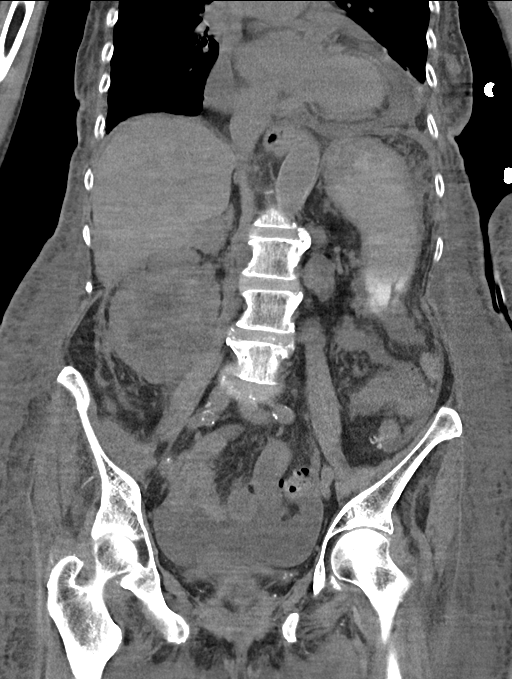

[14 of 46 positions shown; findings below may reference images not displayed]

FINDINGS: Lower chest: Small to moderate-sized pericardial effusion. Small
bilateral pleural effusions with associated bibasilar atelectasis.
Discrete 12 mm pulmonary nodule in the left lower lobe is suspicious
for metastasis. Scarring versus nodularity in the inferior lingula
with 2 separate areas of adjacent nodularity measuring approximately
0.9 and 1.1 cm.

Hepatobiliary: Multiple rounded hypodense lesions of the liver are
consistent with metastatic disease. The largest in the dome of the
liver measures approximately 3.1 cm. There are multiple other
lesions identified in the right lobe and at the juncture of right
and left lobes. No evidence of biliary ductal dilatation. The
gallbladder appears unremarkable.

Pancreas: Unremarkable. No pancreatic ductal dilatation or
surrounding inflammatory changes.

Spleen: Normal in size without focal abnormality.

Adrenals/Urinary Tract: The right kidney is massively enlarged and
heterogeneous, measuring up to 11 cm and demonstrating probable
diffuse involvement by tumor. There appears to be lobulated tumor
extending up from the right kidney towards the liver or potentially
involvement of the right adrenal gland. The left kidney appears
fairly unremarkable by unenhanced CT. The bladder is decompressed by
a Foley catheter.

Stomach/Bowel: No evidence of bowel obstruction. The stomach is
moderately distended. No free air.

Vascular/Lymphatic: Calcified plaque in the abdominal aorta and
iliac arteries without evidence of aneurysmal disease. Numerous
enlarged retroperitoneal lymph nodes surround the aorta. The largest
is a roughly 2.5 cm left para-aortic node. Lymph nodes are also
present anterior to the aorta and in the aortocaval region.

Reproductive: Uterus and bilateral adnexa are unremarkable.

Other: Additional multitude peritoneal masses with the largest in
the left lower quadrant measuring 2.7 x 3.2 cm and lying just deep
to the abdominal wall. There are numerous other smaller nodules seen
throughout the peritoneal cavity. Associated small amount of
dependent ascites in the pelvis. Diffuse body wall edema is
consistent with anasarca.

Musculoskeletal: No bony lesions or fractures identified.
IMPRESSION: 1. Evidence of widespread malignancy with diffuse tumor engulfing
the entire right kidney, multiple hepatic metastases, at least 1 and
possibly 3 metastatic lesions at the left lung base, significant
retroperitoneal lymphadenopathy and multiple peritoneal masses.
There also is tumor extension superior to the right kidney and/or
involvement of the right adrenal gland. Findings are suggestive of
metastatic urothelial carcinoma, metastatic renal cell carcinoma or
lymphoma.
2. Small to moderate pericardial effusion.
3. Small bilateral pleural effusions.
4. Small amount of ascites in the pelvis and diffuse body wall edema
consistent with anasarca.

## 2020-11-10 IMAGING — US ULTRASOUND BIOPSY CORE LIVER
1 series · 12 of 12 positions shown · non-contrast
Comparison: none

INDICATION: 89-year-old female with a history of liver masses, concern for
metastatic carcinoma

[Series 1: ultrasound biopsy core liver · 12 of 12 slices shown]
[im 1/12]
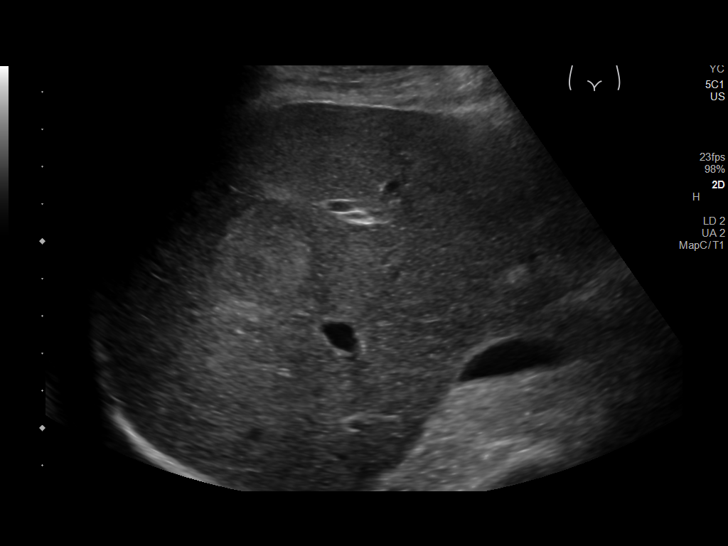
[im 2/12]
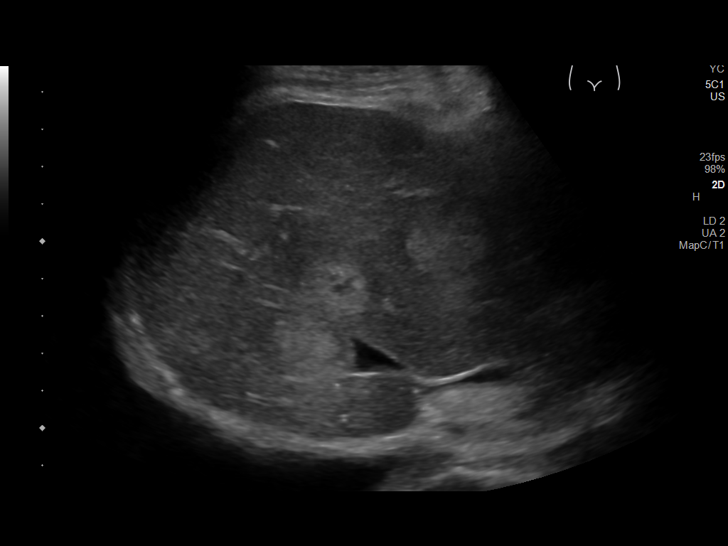
[im 3/12]
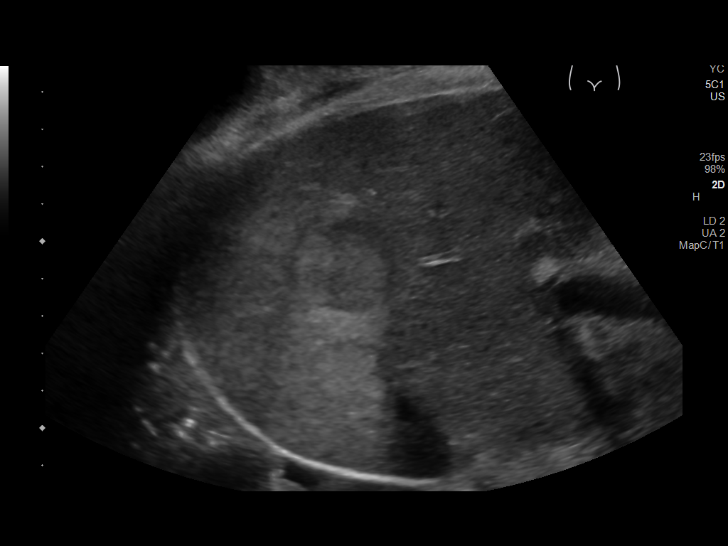
[im 4/12]
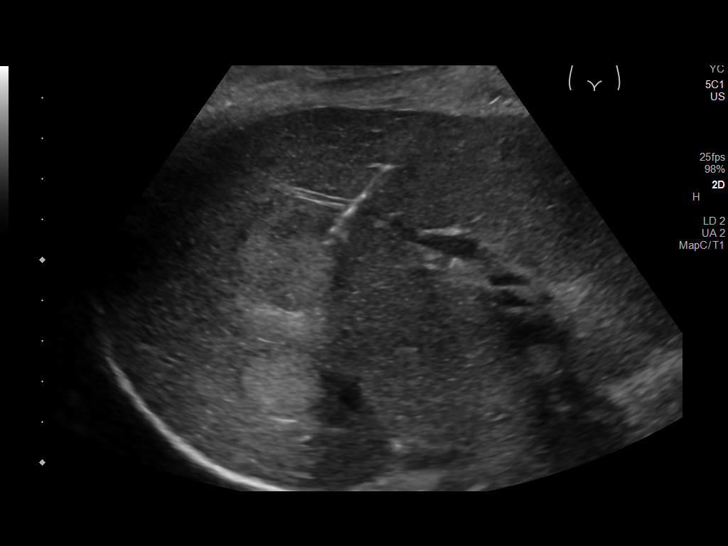
[im 5/12]
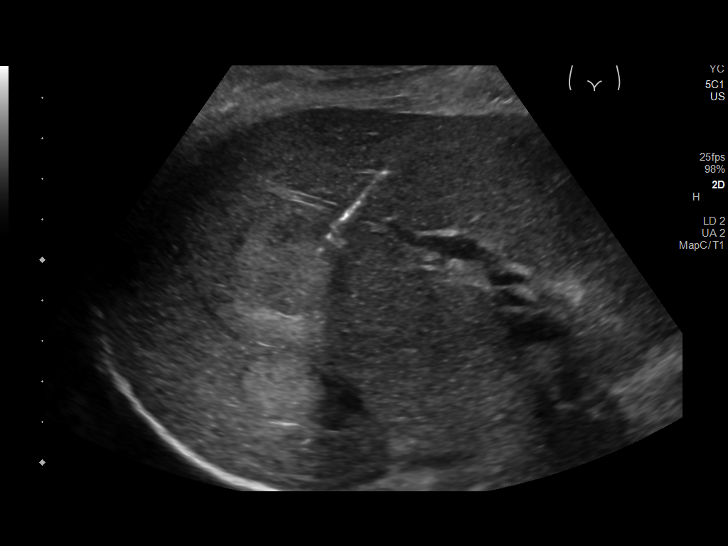
[im 6/12]
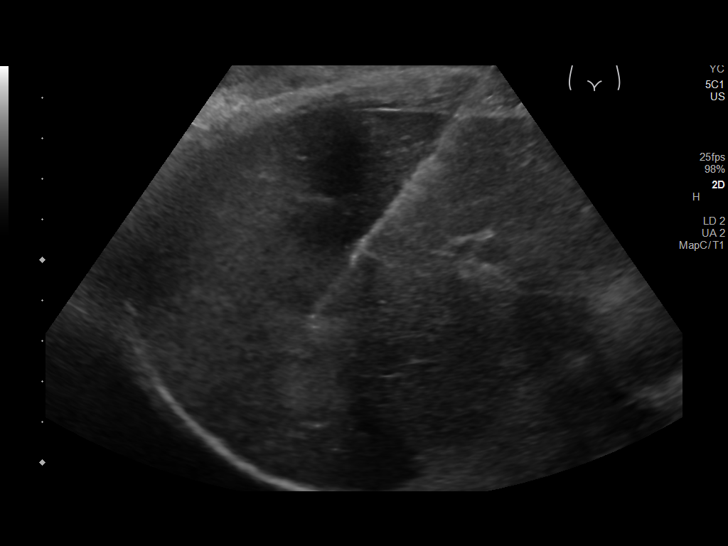
[im 7/12]
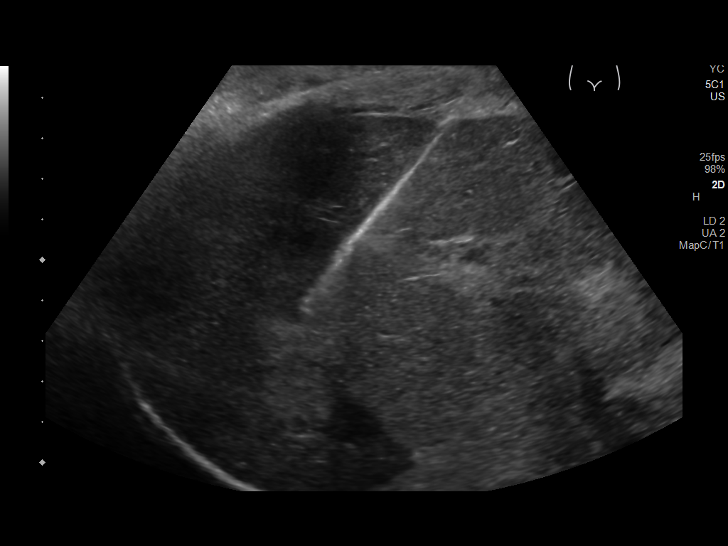
[im 8/12]
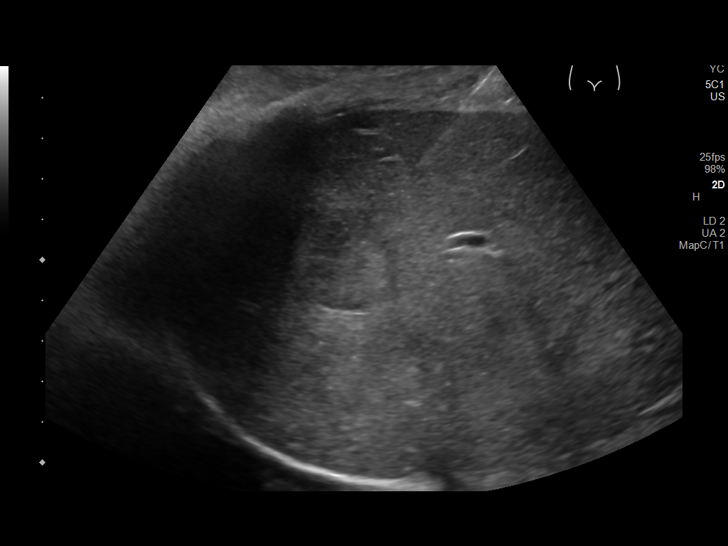
[im 9/12]
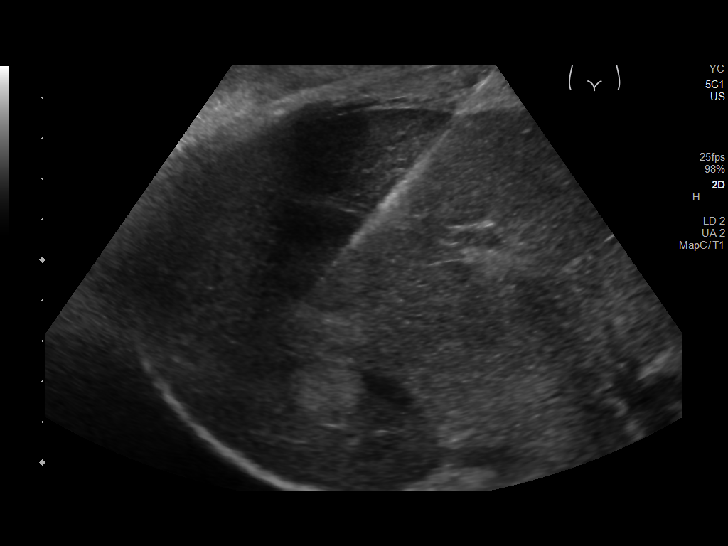
[im 10/12]
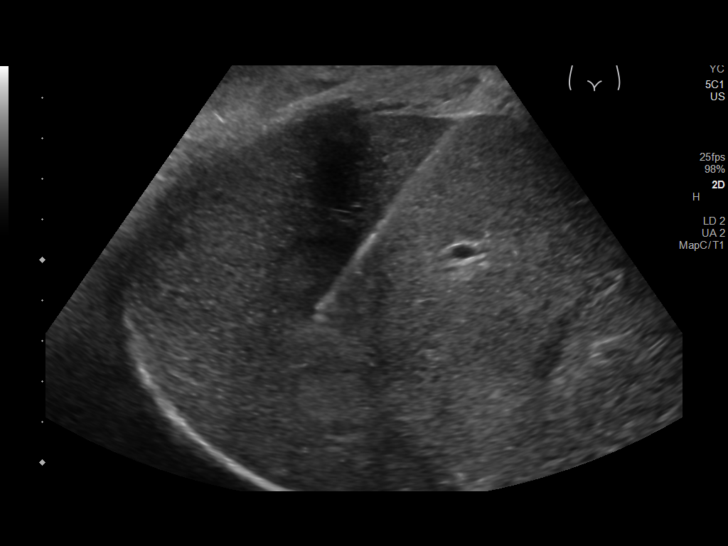
[im 11/12]
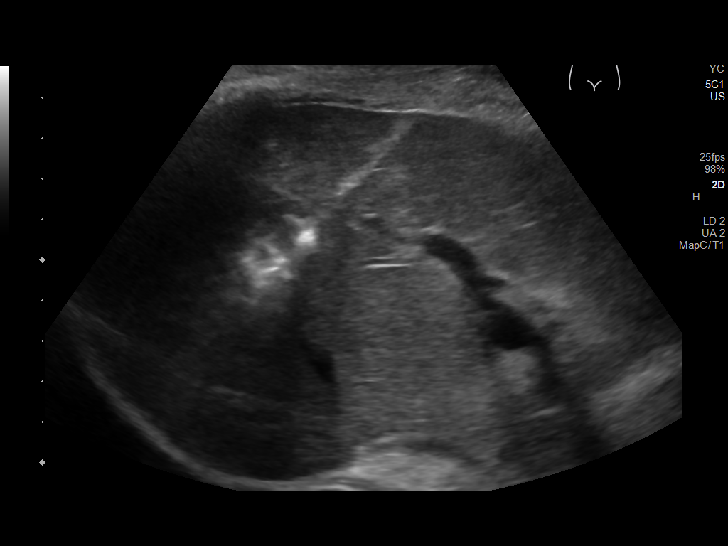
[im 12/12]
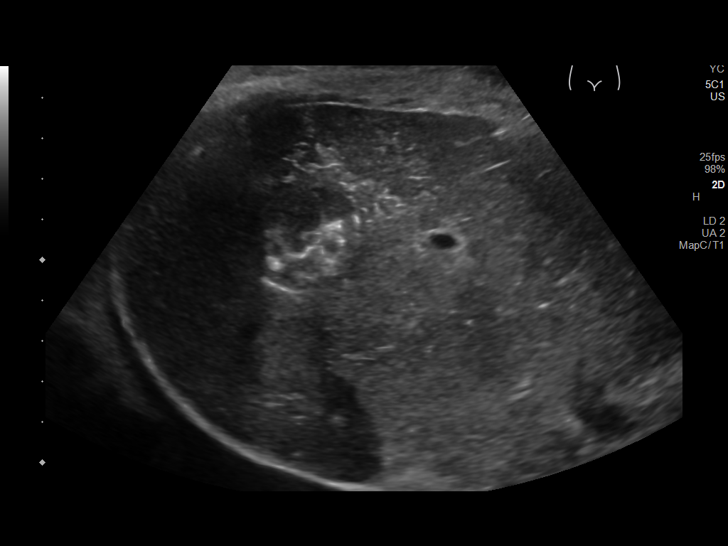

[12 of 12 positions shown; findings below may reference images not displayed]

EXAM:
ULTRASOUND-GUIDED BIOPSY RIGHT LIVER LOBE MASS

MEDICATIONS:
None.

ANESTHESIA/SEDATION:
Moderate (conscious) sedation was employed during this procedure. A
total of Versed 0 mg and Fentanyl 25 mcg was administered
intravenously.

The patient's level of consciousness and vital signs were monitored
continuously by radiology nursing throughout the procedure under my
direct supervision.

FLUOROSCOPY TIME:  NONE

COMPLICATIONS:
None immediate.

PROCEDURE:
Informed written consent was obtained from the patient after a
thorough discussion of the procedural risks, benefits and
alternatives. All questions were addressed. Maximal Sterile Barrier
Technique was utilized including caps, mask, sterile gowns, sterile
gloves, sterile drape, hand hygiene and skin antiseptic. A timeout
was performed prior to the initiation of the procedure

Ultrasound survey of the right liver lobe performed with images
stored and sent to PACs.

The right lower thorax/right upper abdomen was prepped with
chlorhexidine in a sterile fashion, and a sterile drape was applied
covering the operative field. A sterile gown and sterile gloves were
used for the procedure. Local anesthesia was provided with 1%
Lidocaine.

Once the patient is prepped and draped sterilely and the skin and
subcutaneous tissues were generously infiltrated with 1% lidocaine,
a small stab incision was made with an 11 blade scalpel.

A 17 gauge introducer needle was advanced under ultrasound guidance
in an intercostal location into the right liver lobe, targeting
right liver mass. The stylet was removed, and 4 separate 18 gauge
core biopsy were retrieved. Samples were placed into formalin for
transportation to the lab.

Gel-Foam pledgets were then infused with a small amount of saline
for assistance with hemostasis.

The needle was removed, and a final ultrasound image was performed.

The patient tolerated the procedure well and remained
hemodynamically stable throughout.

No complications were encountered and no significant blood loss was
encounter.
IMPRESSION: Status post ultrasound-guided liver mass biopsy. Tissue specimen
sent to pathology for complete histopathologic analysis.
